# Patient Record
Sex: Male | Born: 1950 | Hispanic: Refuse to answer | Marital: Married | Smoking: Former smoker
Health system: Southern US, Community
[De-identification: ages and names within clinical notes are randomized; demographics above are authoritative.]

## PROBLEM LIST (undated history)

## (undated) DIAGNOSIS — F419 Anxiety disorder, unspecified: Secondary | ICD-10-CM

## (undated) DIAGNOSIS — M1990 Unspecified osteoarthritis, unspecified site: Secondary | ICD-10-CM

## (undated) HISTORY — PX: TONSILLECTOMY: SUR1361

## (undated) HISTORY — PX: SMALL INTESTINE SURGERY: SHX150

---

## 2006-12-18 ENCOUNTER — Ambulatory Visit (HOSPITAL_COMMUNITY): Admission: RE | Admit: 2006-12-18 | Discharge: 2006-12-18 | Payer: Self-pay | Admitting: Internal Medicine

## 2007-05-17 ENCOUNTER — Emergency Department (HOSPITAL_COMMUNITY): Admission: EM | Admit: 2007-05-17 | Discharge: 2007-05-17 | Payer: Self-pay | Admitting: Emergency Medicine

## 2009-05-10 IMAGING — CR DG CHEST 2V
2 series · 2 of 2 positions shown · non-contrast
Comparison: None

CLINICAL DATA: Chest pain, dull ache in the left breast area.
Night sweats for 2 weeks.  History of smoking.  No priors .

CHEST - 2 VIEW

[view not recorded (1 of 2)]
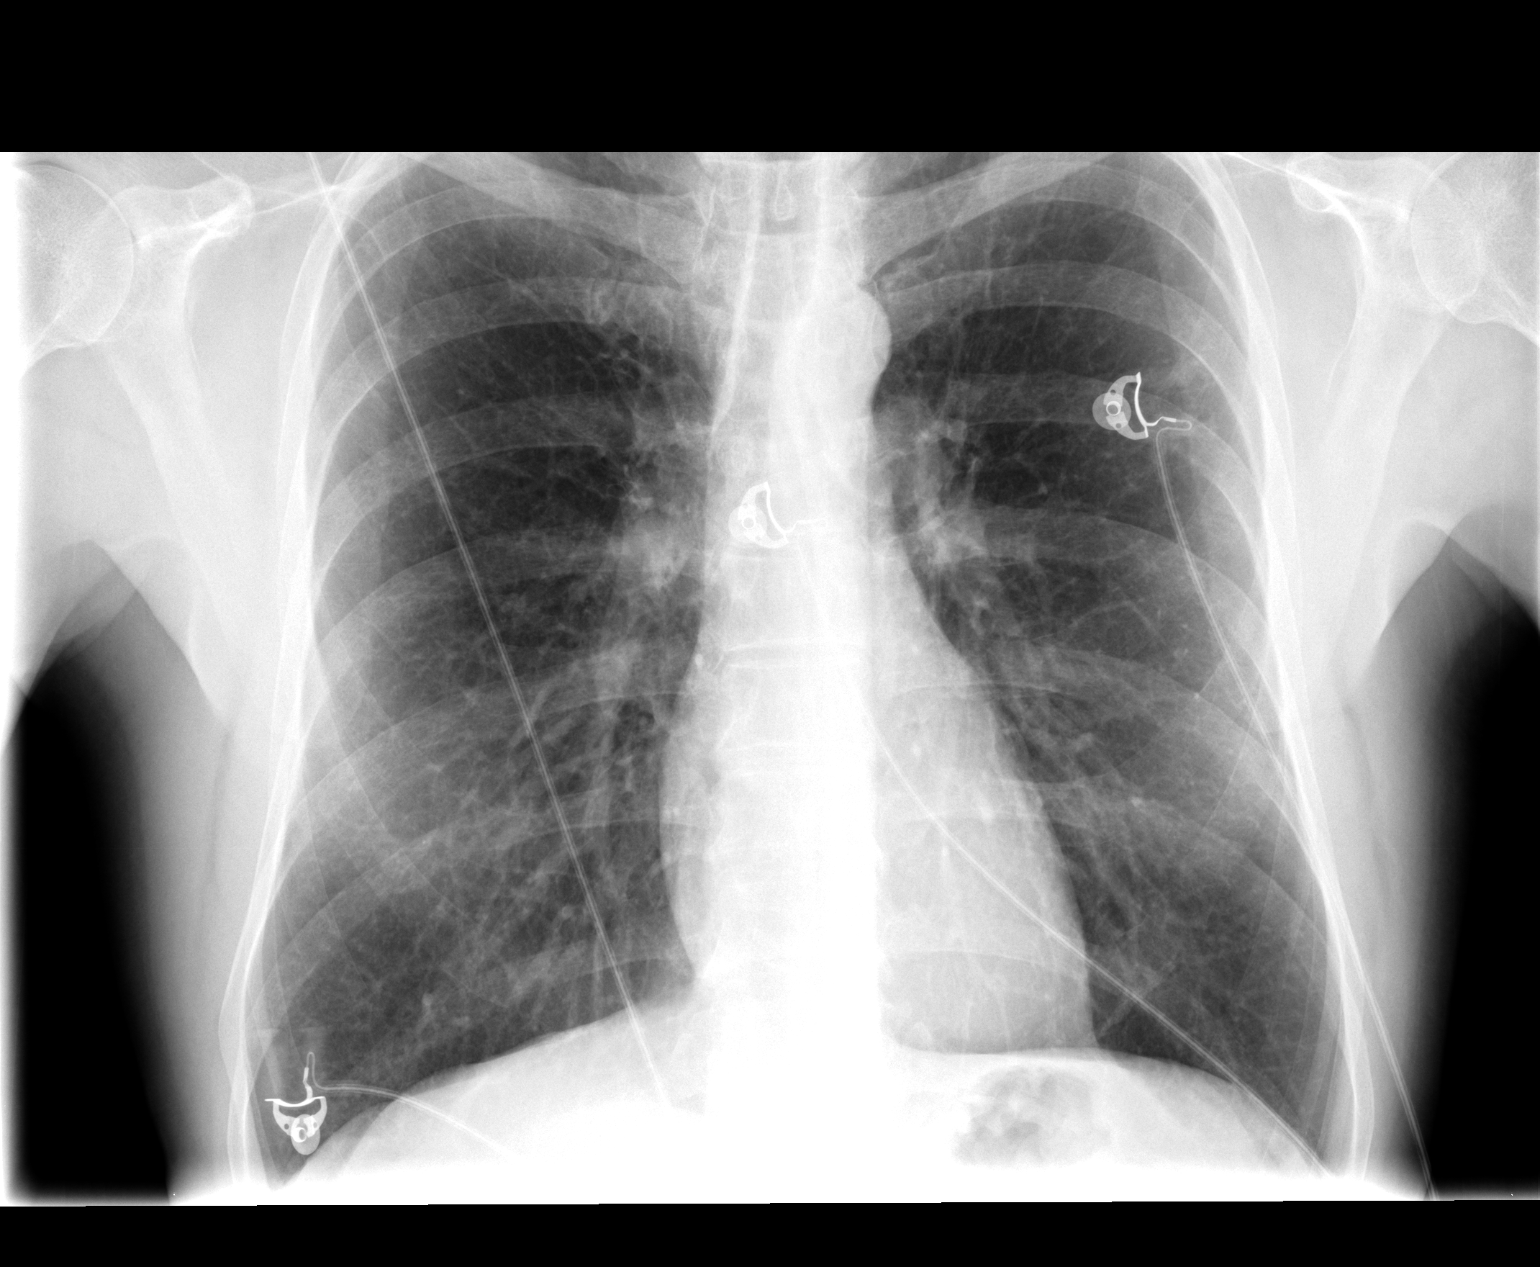

[view not recorded (2 of 2)]
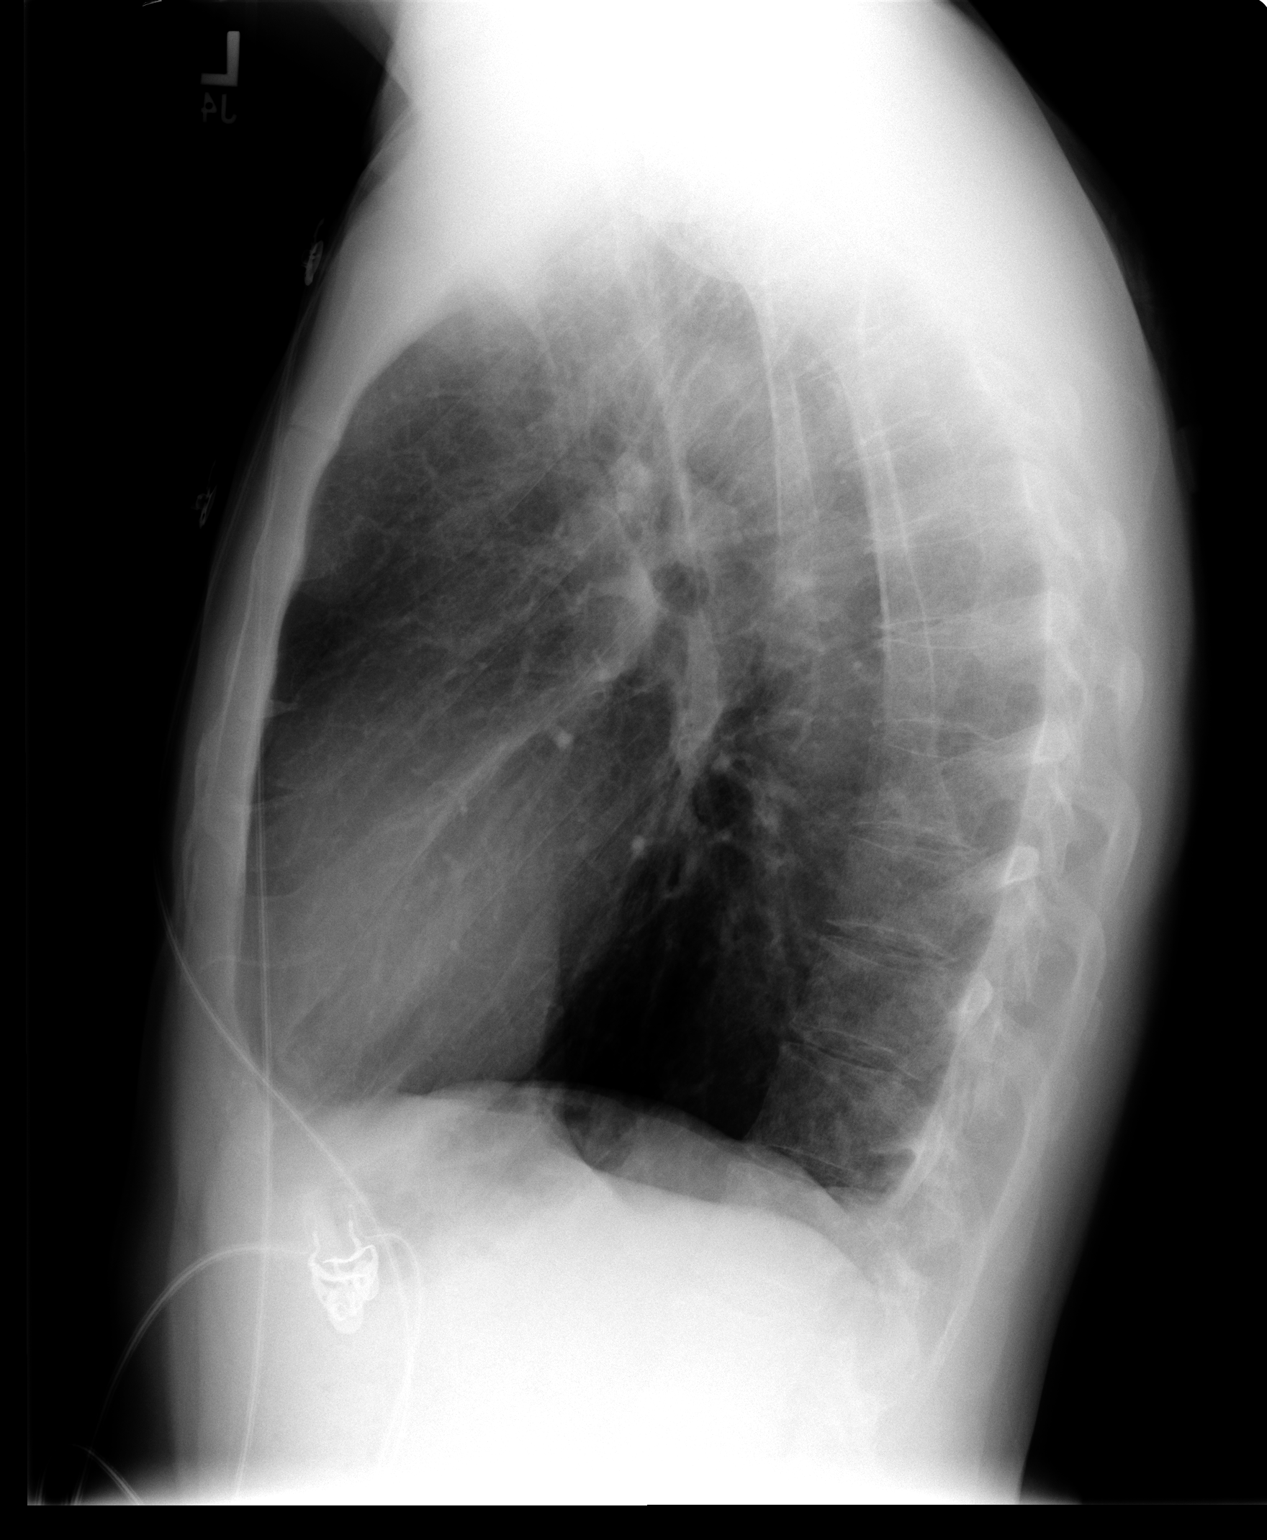

[2 of 2 positions shown; findings below may reference images not displayed]

FINDINGS: Lungs are hyperinflated.  There are perihilar
peribronchial changes which are likely chronic.  No focal
consolidations or pleural effusions are identified.
IMPRESSION: Hyperinflation and bronchitic changes.  No focal, acute pulmonary
abnormality.

## 2015-01-17 DIAGNOSIS — M9903 Segmental and somatic dysfunction of lumbar region: Secondary | ICD-10-CM | POA: Diagnosis not present

## 2015-01-17 DIAGNOSIS — M9905 Segmental and somatic dysfunction of pelvic region: Secondary | ICD-10-CM | POA: Diagnosis not present

## 2015-01-17 DIAGNOSIS — K5730 Diverticulosis of large intestine without perforation or abscess without bleeding: Secondary | ICD-10-CM | POA: Diagnosis not present

## 2015-01-17 DIAGNOSIS — R197 Diarrhea, unspecified: Secondary | ICD-10-CM | POA: Diagnosis not present

## 2015-01-17 DIAGNOSIS — M9904 Segmental and somatic dysfunction of sacral region: Secondary | ICD-10-CM | POA: Diagnosis not present

## 2015-01-17 DIAGNOSIS — M5136 Other intervertebral disc degeneration, lumbar region: Secondary | ICD-10-CM | POA: Diagnosis not present

## 2015-01-17 DIAGNOSIS — Z1211 Encounter for screening for malignant neoplasm of colon: Secondary | ICD-10-CM | POA: Diagnosis not present

## 2015-02-06 DIAGNOSIS — D125 Benign neoplasm of sigmoid colon: Secondary | ICD-10-CM | POA: Diagnosis not present

## 2015-02-06 DIAGNOSIS — D123 Benign neoplasm of transverse colon: Secondary | ICD-10-CM | POA: Diagnosis not present

## 2015-02-06 DIAGNOSIS — D122 Benign neoplasm of ascending colon: Secondary | ICD-10-CM | POA: Diagnosis not present

## 2015-02-06 DIAGNOSIS — Z1211 Encounter for screening for malignant neoplasm of colon: Secondary | ICD-10-CM | POA: Diagnosis not present

## 2015-02-06 DIAGNOSIS — K635 Polyp of colon: Secondary | ICD-10-CM | POA: Diagnosis not present

## 2015-02-06 DIAGNOSIS — K5730 Diverticulosis of large intestine without perforation or abscess without bleeding: Secondary | ICD-10-CM | POA: Diagnosis not present

## 2015-02-21 DIAGNOSIS — Z0000 Encounter for general adult medical examination without abnormal findings: Secondary | ICD-10-CM | POA: Diagnosis not present

## 2015-02-21 DIAGNOSIS — Z125 Encounter for screening for malignant neoplasm of prostate: Secondary | ICD-10-CM | POA: Diagnosis not present

## 2015-03-01 DIAGNOSIS — Z6820 Body mass index (BMI) 20.0-20.9, adult: Secondary | ICD-10-CM | POA: Diagnosis not present

## 2015-03-01 DIAGNOSIS — Z0001 Encounter for general adult medical examination with abnormal findings: Secondary | ICD-10-CM | POA: Diagnosis not present

## 2015-03-01 DIAGNOSIS — N400 Enlarged prostate without lower urinary tract symptoms: Secondary | ICD-10-CM | POA: Diagnosis not present

## 2015-03-20 DIAGNOSIS — H6123 Impacted cerumen, bilateral: Secondary | ICD-10-CM | POA: Diagnosis not present

## 2015-07-20 DIAGNOSIS — Z0101 Encounter for examination of eyes and vision with abnormal findings: Secondary | ICD-10-CM | POA: Diagnosis not present

## 2015-08-20 DIAGNOSIS — M5134 Other intervertebral disc degeneration, thoracic region: Secondary | ICD-10-CM | POA: Diagnosis not present

## 2015-08-20 DIAGNOSIS — M9902 Segmental and somatic dysfunction of thoracic region: Secondary | ICD-10-CM | POA: Diagnosis not present

## 2015-08-20 DIAGNOSIS — M546 Pain in thoracic spine: Secondary | ICD-10-CM | POA: Diagnosis not present

## 2015-09-24 ENCOUNTER — Ambulatory Visit (INDEPENDENT_AMBULATORY_CARE_PROVIDER_SITE_OTHER): Payer: PPO | Admitting: Otolaryngology

## 2015-09-24 DIAGNOSIS — H6123 Impacted cerumen, bilateral: Secondary | ICD-10-CM

## 2015-10-22 DIAGNOSIS — Z23 Encounter for immunization: Secondary | ICD-10-CM | POA: Diagnosis not present

## 2016-02-28 ENCOUNTER — Ambulatory Visit (INDEPENDENT_AMBULATORY_CARE_PROVIDER_SITE_OTHER): Payer: PPO | Admitting: Otolaryngology

## 2016-02-28 DIAGNOSIS — H6123 Impacted cerumen, bilateral: Secondary | ICD-10-CM | POA: Diagnosis not present

## 2016-03-05 DIAGNOSIS — Z79899 Other long term (current) drug therapy: Secondary | ICD-10-CM | POA: Diagnosis not present

## 2016-03-05 DIAGNOSIS — N400 Enlarged prostate without lower urinary tract symptoms: Secondary | ICD-10-CM | POA: Diagnosis not present

## 2016-03-05 DIAGNOSIS — N529 Male erectile dysfunction, unspecified: Secondary | ICD-10-CM | POA: Diagnosis not present

## 2016-03-05 DIAGNOSIS — Z125 Encounter for screening for malignant neoplasm of prostate: Secondary | ICD-10-CM | POA: Diagnosis not present

## 2016-03-13 DIAGNOSIS — Z0001 Encounter for general adult medical examination with abnormal findings: Secondary | ICD-10-CM | POA: Diagnosis not present

## 2016-03-13 DIAGNOSIS — R079 Chest pain, unspecified: Secondary | ICD-10-CM | POA: Diagnosis not present

## 2016-03-13 DIAGNOSIS — R972 Elevated prostate specific antigen [PSA]: Secondary | ICD-10-CM | POA: Diagnosis not present

## 2016-03-13 DIAGNOSIS — Z6823 Body mass index (BMI) 23.0-23.9, adult: Secondary | ICD-10-CM | POA: Diagnosis not present

## 2016-09-04 ENCOUNTER — Ambulatory Visit (INDEPENDENT_AMBULATORY_CARE_PROVIDER_SITE_OTHER): Payer: PPO | Admitting: Otolaryngology

## 2016-09-04 DIAGNOSIS — H6123 Impacted cerumen, bilateral: Secondary | ICD-10-CM | POA: Diagnosis not present

## 2016-10-14 DIAGNOSIS — M9903 Segmental and somatic dysfunction of lumbar region: Secondary | ICD-10-CM | POA: Diagnosis not present

## 2016-10-14 DIAGNOSIS — M9904 Segmental and somatic dysfunction of sacral region: Secondary | ICD-10-CM | POA: Diagnosis not present

## 2016-10-14 DIAGNOSIS — M9905 Segmental and somatic dysfunction of pelvic region: Secondary | ICD-10-CM | POA: Diagnosis not present

## 2016-10-14 DIAGNOSIS — M5136 Other intervertebral disc degeneration, lumbar region: Secondary | ICD-10-CM | POA: Diagnosis not present

## 2016-10-15 DIAGNOSIS — M9905 Segmental and somatic dysfunction of pelvic region: Secondary | ICD-10-CM | POA: Diagnosis not present

## 2016-10-15 DIAGNOSIS — M9904 Segmental and somatic dysfunction of sacral region: Secondary | ICD-10-CM | POA: Diagnosis not present

## 2016-10-15 DIAGNOSIS — M5136 Other intervertebral disc degeneration, lumbar region: Secondary | ICD-10-CM | POA: Diagnosis not present

## 2016-10-15 DIAGNOSIS — M9903 Segmental and somatic dysfunction of lumbar region: Secondary | ICD-10-CM | POA: Diagnosis not present

## 2016-10-16 DIAGNOSIS — M9904 Segmental and somatic dysfunction of sacral region: Secondary | ICD-10-CM | POA: Diagnosis not present

## 2016-10-16 DIAGNOSIS — M9903 Segmental and somatic dysfunction of lumbar region: Secondary | ICD-10-CM | POA: Diagnosis not present

## 2016-10-16 DIAGNOSIS — M9905 Segmental and somatic dysfunction of pelvic region: Secondary | ICD-10-CM | POA: Diagnosis not present

## 2016-10-16 DIAGNOSIS — M5136 Other intervertebral disc degeneration, lumbar region: Secondary | ICD-10-CM | POA: Diagnosis not present

## 2016-10-20 DIAGNOSIS — M9905 Segmental and somatic dysfunction of pelvic region: Secondary | ICD-10-CM | POA: Diagnosis not present

## 2016-10-20 DIAGNOSIS — M9904 Segmental and somatic dysfunction of sacral region: Secondary | ICD-10-CM | POA: Diagnosis not present

## 2016-10-20 DIAGNOSIS — M9903 Segmental and somatic dysfunction of lumbar region: Secondary | ICD-10-CM | POA: Diagnosis not present

## 2016-10-20 DIAGNOSIS — M5136 Other intervertebral disc degeneration, lumbar region: Secondary | ICD-10-CM | POA: Diagnosis not present

## 2016-10-22 DIAGNOSIS — M9904 Segmental and somatic dysfunction of sacral region: Secondary | ICD-10-CM | POA: Diagnosis not present

## 2016-10-22 DIAGNOSIS — M9903 Segmental and somatic dysfunction of lumbar region: Secondary | ICD-10-CM | POA: Diagnosis not present

## 2016-10-22 DIAGNOSIS — M5136 Other intervertebral disc degeneration, lumbar region: Secondary | ICD-10-CM | POA: Diagnosis not present

## 2016-10-22 DIAGNOSIS — M9905 Segmental and somatic dysfunction of pelvic region: Secondary | ICD-10-CM | POA: Diagnosis not present

## 2016-10-29 DIAGNOSIS — M9905 Segmental and somatic dysfunction of pelvic region: Secondary | ICD-10-CM | POA: Diagnosis not present

## 2016-10-29 DIAGNOSIS — M9903 Segmental and somatic dysfunction of lumbar region: Secondary | ICD-10-CM | POA: Diagnosis not present

## 2016-10-29 DIAGNOSIS — M5136 Other intervertebral disc degeneration, lumbar region: Secondary | ICD-10-CM | POA: Diagnosis not present

## 2016-10-29 DIAGNOSIS — M9904 Segmental and somatic dysfunction of sacral region: Secondary | ICD-10-CM | POA: Diagnosis not present

## 2016-10-30 DIAGNOSIS — Z23 Encounter for immunization: Secondary | ICD-10-CM | POA: Diagnosis not present

## 2016-11-21 DIAGNOSIS — M9904 Segmental and somatic dysfunction of sacral region: Secondary | ICD-10-CM | POA: Diagnosis not present

## 2016-11-21 DIAGNOSIS — M5136 Other intervertebral disc degeneration, lumbar region: Secondary | ICD-10-CM | POA: Diagnosis not present

## 2016-11-21 DIAGNOSIS — M9903 Segmental and somatic dysfunction of lumbar region: Secondary | ICD-10-CM | POA: Diagnosis not present

## 2016-11-21 DIAGNOSIS — M9905 Segmental and somatic dysfunction of pelvic region: Secondary | ICD-10-CM | POA: Diagnosis not present

## 2016-11-26 DIAGNOSIS — H35363 Drusen (degenerative) of macula, bilateral: Secondary | ICD-10-CM | POA: Diagnosis not present

## 2016-11-26 DIAGNOSIS — H5202 Hypermetropia, left eye: Secondary | ICD-10-CM | POA: Diagnosis not present

## 2016-11-26 DIAGNOSIS — H52223 Regular astigmatism, bilateral: Secondary | ICD-10-CM | POA: Diagnosis not present

## 2016-11-26 DIAGNOSIS — H5211 Myopia, right eye: Secondary | ICD-10-CM | POA: Diagnosis not present

## 2017-03-12 ENCOUNTER — Ambulatory Visit (INDEPENDENT_AMBULATORY_CARE_PROVIDER_SITE_OTHER): Payer: PPO | Admitting: Otolaryngology

## 2017-03-12 DIAGNOSIS — H6123 Impacted cerumen, bilateral: Secondary | ICD-10-CM

## 2017-03-26 DIAGNOSIS — R972 Elevated prostate specific antigen [PSA]: Secondary | ICD-10-CM | POA: Diagnosis not present

## 2017-03-26 DIAGNOSIS — N400 Enlarged prostate without lower urinary tract symptoms: Secondary | ICD-10-CM | POA: Diagnosis not present

## 2017-04-02 DIAGNOSIS — Z6822 Body mass index (BMI) 22.0-22.9, adult: Secondary | ICD-10-CM | POA: Diagnosis not present

## 2017-04-02 DIAGNOSIS — N401 Enlarged prostate with lower urinary tract symptoms: Secondary | ICD-10-CM | POA: Diagnosis not present

## 2017-04-02 DIAGNOSIS — R079 Chest pain, unspecified: Secondary | ICD-10-CM | POA: Diagnosis not present

## 2017-04-02 DIAGNOSIS — K635 Polyp of colon: Secondary | ICD-10-CM | POA: Diagnosis not present

## 2017-08-31 DIAGNOSIS — M9905 Segmental and somatic dysfunction of pelvic region: Secondary | ICD-10-CM | POA: Diagnosis not present

## 2017-08-31 DIAGNOSIS — M9903 Segmental and somatic dysfunction of lumbar region: Secondary | ICD-10-CM | POA: Diagnosis not present

## 2017-08-31 DIAGNOSIS — M5136 Other intervertebral disc degeneration, lumbar region: Secondary | ICD-10-CM | POA: Diagnosis not present

## 2017-08-31 DIAGNOSIS — M9904 Segmental and somatic dysfunction of sacral region: Secondary | ICD-10-CM | POA: Diagnosis not present

## 2017-09-01 DIAGNOSIS — M9903 Segmental and somatic dysfunction of lumbar region: Secondary | ICD-10-CM | POA: Diagnosis not present

## 2017-09-01 DIAGNOSIS — M9905 Segmental and somatic dysfunction of pelvic region: Secondary | ICD-10-CM | POA: Diagnosis not present

## 2017-09-01 DIAGNOSIS — M5136 Other intervertebral disc degeneration, lumbar region: Secondary | ICD-10-CM | POA: Diagnosis not present

## 2017-09-01 DIAGNOSIS — M9904 Segmental and somatic dysfunction of sacral region: Secondary | ICD-10-CM | POA: Diagnosis not present

## 2017-09-02 DIAGNOSIS — M9903 Segmental and somatic dysfunction of lumbar region: Secondary | ICD-10-CM | POA: Diagnosis not present

## 2017-09-02 DIAGNOSIS — M9904 Segmental and somatic dysfunction of sacral region: Secondary | ICD-10-CM | POA: Diagnosis not present

## 2017-09-02 DIAGNOSIS — M9905 Segmental and somatic dysfunction of pelvic region: Secondary | ICD-10-CM | POA: Diagnosis not present

## 2017-09-02 DIAGNOSIS — M5136 Other intervertebral disc degeneration, lumbar region: Secondary | ICD-10-CM | POA: Diagnosis not present

## 2017-09-03 DIAGNOSIS — M9904 Segmental and somatic dysfunction of sacral region: Secondary | ICD-10-CM | POA: Diagnosis not present

## 2017-09-03 DIAGNOSIS — M9905 Segmental and somatic dysfunction of pelvic region: Secondary | ICD-10-CM | POA: Diagnosis not present

## 2017-09-03 DIAGNOSIS — M9903 Segmental and somatic dysfunction of lumbar region: Secondary | ICD-10-CM | POA: Diagnosis not present

## 2017-09-03 DIAGNOSIS — M5136 Other intervertebral disc degeneration, lumbar region: Secondary | ICD-10-CM | POA: Diagnosis not present

## 2017-09-07 DIAGNOSIS — M9904 Segmental and somatic dysfunction of sacral region: Secondary | ICD-10-CM | POA: Diagnosis not present

## 2017-09-07 DIAGNOSIS — M9905 Segmental and somatic dysfunction of pelvic region: Secondary | ICD-10-CM | POA: Diagnosis not present

## 2017-09-07 DIAGNOSIS — M9903 Segmental and somatic dysfunction of lumbar region: Secondary | ICD-10-CM | POA: Diagnosis not present

## 2017-09-07 DIAGNOSIS — M5136 Other intervertebral disc degeneration, lumbar region: Secondary | ICD-10-CM | POA: Diagnosis not present

## 2017-09-08 DIAGNOSIS — M9903 Segmental and somatic dysfunction of lumbar region: Secondary | ICD-10-CM | POA: Diagnosis not present

## 2017-09-08 DIAGNOSIS — M9905 Segmental and somatic dysfunction of pelvic region: Secondary | ICD-10-CM | POA: Diagnosis not present

## 2017-09-08 DIAGNOSIS — M5136 Other intervertebral disc degeneration, lumbar region: Secondary | ICD-10-CM | POA: Diagnosis not present

## 2017-09-08 DIAGNOSIS — M9904 Segmental and somatic dysfunction of sacral region: Secondary | ICD-10-CM | POA: Diagnosis not present

## 2017-09-09 DIAGNOSIS — M5136 Other intervertebral disc degeneration, lumbar region: Secondary | ICD-10-CM | POA: Diagnosis not present

## 2017-09-09 DIAGNOSIS — M9905 Segmental and somatic dysfunction of pelvic region: Secondary | ICD-10-CM | POA: Diagnosis not present

## 2017-09-09 DIAGNOSIS — M9903 Segmental and somatic dysfunction of lumbar region: Secondary | ICD-10-CM | POA: Diagnosis not present

## 2017-09-09 DIAGNOSIS — M9904 Segmental and somatic dysfunction of sacral region: Secondary | ICD-10-CM | POA: Diagnosis not present

## 2017-09-10 DIAGNOSIS — M5136 Other intervertebral disc degeneration, lumbar region: Secondary | ICD-10-CM | POA: Diagnosis not present

## 2017-09-10 DIAGNOSIS — M9904 Segmental and somatic dysfunction of sacral region: Secondary | ICD-10-CM | POA: Diagnosis not present

## 2017-09-10 DIAGNOSIS — M9903 Segmental and somatic dysfunction of lumbar region: Secondary | ICD-10-CM | POA: Diagnosis not present

## 2017-09-10 DIAGNOSIS — M9905 Segmental and somatic dysfunction of pelvic region: Secondary | ICD-10-CM | POA: Diagnosis not present

## 2017-09-15 DIAGNOSIS — M9905 Segmental and somatic dysfunction of pelvic region: Secondary | ICD-10-CM | POA: Diagnosis not present

## 2017-09-15 DIAGNOSIS — M9903 Segmental and somatic dysfunction of lumbar region: Secondary | ICD-10-CM | POA: Diagnosis not present

## 2017-09-15 DIAGNOSIS — M5136 Other intervertebral disc degeneration, lumbar region: Secondary | ICD-10-CM | POA: Diagnosis not present

## 2017-09-15 DIAGNOSIS — M9904 Segmental and somatic dysfunction of sacral region: Secondary | ICD-10-CM | POA: Diagnosis not present

## 2017-09-16 DIAGNOSIS — M9903 Segmental and somatic dysfunction of lumbar region: Secondary | ICD-10-CM | POA: Diagnosis not present

## 2017-09-16 DIAGNOSIS — M9905 Segmental and somatic dysfunction of pelvic region: Secondary | ICD-10-CM | POA: Diagnosis not present

## 2017-09-16 DIAGNOSIS — M9904 Segmental and somatic dysfunction of sacral region: Secondary | ICD-10-CM | POA: Diagnosis not present

## 2017-09-16 DIAGNOSIS — M5136 Other intervertebral disc degeneration, lumbar region: Secondary | ICD-10-CM | POA: Diagnosis not present

## 2017-09-17 DIAGNOSIS — M9903 Segmental and somatic dysfunction of lumbar region: Secondary | ICD-10-CM | POA: Diagnosis not present

## 2017-09-17 DIAGNOSIS — M9905 Segmental and somatic dysfunction of pelvic region: Secondary | ICD-10-CM | POA: Diagnosis not present

## 2017-09-17 DIAGNOSIS — M5136 Other intervertebral disc degeneration, lumbar region: Secondary | ICD-10-CM | POA: Diagnosis not present

## 2017-09-17 DIAGNOSIS — M9904 Segmental and somatic dysfunction of sacral region: Secondary | ICD-10-CM | POA: Diagnosis not present

## 2017-09-23 DIAGNOSIS — M9905 Segmental and somatic dysfunction of pelvic region: Secondary | ICD-10-CM | POA: Diagnosis not present

## 2017-09-23 DIAGNOSIS — M9903 Segmental and somatic dysfunction of lumbar region: Secondary | ICD-10-CM | POA: Diagnosis not present

## 2017-09-23 DIAGNOSIS — M545 Low back pain: Secondary | ICD-10-CM | POA: Diagnosis not present

## 2017-09-23 DIAGNOSIS — M9902 Segmental and somatic dysfunction of thoracic region: Secondary | ICD-10-CM | POA: Diagnosis not present

## 2017-09-25 DIAGNOSIS — K4030 Unilateral inguinal hernia, with obstruction, without gangrene, not specified as recurrent: Secondary | ICD-10-CM | POA: Diagnosis not present

## 2017-09-29 DIAGNOSIS — M5136 Other intervertebral disc degeneration, lumbar region: Secondary | ICD-10-CM | POA: Diagnosis not present

## 2017-09-29 DIAGNOSIS — M9904 Segmental and somatic dysfunction of sacral region: Secondary | ICD-10-CM | POA: Diagnosis not present

## 2017-09-29 DIAGNOSIS — M9903 Segmental and somatic dysfunction of lumbar region: Secondary | ICD-10-CM | POA: Diagnosis not present

## 2017-09-29 DIAGNOSIS — M9905 Segmental and somatic dysfunction of pelvic region: Secondary | ICD-10-CM | POA: Diagnosis not present

## 2017-10-05 DIAGNOSIS — M9904 Segmental and somatic dysfunction of sacral region: Secondary | ICD-10-CM | POA: Diagnosis not present

## 2017-10-05 DIAGNOSIS — M5136 Other intervertebral disc degeneration, lumbar region: Secondary | ICD-10-CM | POA: Diagnosis not present

## 2017-10-05 DIAGNOSIS — M9905 Segmental and somatic dysfunction of pelvic region: Secondary | ICD-10-CM | POA: Diagnosis not present

## 2017-10-05 DIAGNOSIS — M9903 Segmental and somatic dysfunction of lumbar region: Secondary | ICD-10-CM | POA: Diagnosis not present

## 2017-10-14 DIAGNOSIS — M9902 Segmental and somatic dysfunction of thoracic region: Secondary | ICD-10-CM | POA: Diagnosis not present

## 2017-10-14 DIAGNOSIS — M9905 Segmental and somatic dysfunction of pelvic region: Secondary | ICD-10-CM | POA: Diagnosis not present

## 2017-10-14 DIAGNOSIS — M545 Low back pain: Secondary | ICD-10-CM | POA: Diagnosis not present

## 2017-10-14 DIAGNOSIS — M9903 Segmental and somatic dysfunction of lumbar region: Secondary | ICD-10-CM | POA: Diagnosis not present

## 2017-10-15 ENCOUNTER — Ambulatory Visit: Payer: PPO | Admitting: General Surgery

## 2017-10-16 DIAGNOSIS — K4090 Unilateral inguinal hernia, without obstruction or gangrene, not specified as recurrent: Secondary | ICD-10-CM | POA: Diagnosis not present

## 2017-10-21 DIAGNOSIS — M9905 Segmental and somatic dysfunction of pelvic region: Secondary | ICD-10-CM | POA: Diagnosis not present

## 2017-10-21 DIAGNOSIS — M9903 Segmental and somatic dysfunction of lumbar region: Secondary | ICD-10-CM | POA: Diagnosis not present

## 2017-10-21 DIAGNOSIS — M9904 Segmental and somatic dysfunction of sacral region: Secondary | ICD-10-CM | POA: Diagnosis not present

## 2017-10-21 DIAGNOSIS — M5136 Other intervertebral disc degeneration, lumbar region: Secondary | ICD-10-CM | POA: Diagnosis not present

## 2017-10-22 ENCOUNTER — Other Ambulatory Visit: Payer: Self-pay | Admitting: General Surgery

## 2017-10-22 ENCOUNTER — Ambulatory Visit: Payer: PPO | Admitting: General Surgery

## 2017-10-22 DIAGNOSIS — M9904 Segmental and somatic dysfunction of sacral region: Secondary | ICD-10-CM | POA: Diagnosis not present

## 2017-10-22 DIAGNOSIS — M9903 Segmental and somatic dysfunction of lumbar region: Secondary | ICD-10-CM | POA: Diagnosis not present

## 2017-10-22 DIAGNOSIS — M9905 Segmental and somatic dysfunction of pelvic region: Secondary | ICD-10-CM | POA: Diagnosis not present

## 2017-10-22 DIAGNOSIS — M5136 Other intervertebral disc degeneration, lumbar region: Secondary | ICD-10-CM | POA: Diagnosis not present

## 2017-10-22 NOTE — Pre-Procedure Instructions (Signed)
David Gillespie  10/22/2017      [facility] [phone] - [place], [place] - [place] [address] [place] [address] [place] [address] Phone: [phone] Fax: [phone]    Your procedure is scheduled on Wednesday October 16.  Report to [facility] Admitting at 9:00 A.M.  Call this number if you have problems the morning of surgery:  [phone]   Remember:  Do not eat or drink after midnight.    Take these medicines the morning of surgery with A SIP OF WATER: NONE  7 days prior to surgery STOP taking any Aspirin(unless otherwise instructed by your surgeon), Aleve, Naproxen, Ibuprofen, Motrin, Advil, Goody's, BC's, all herbal medications, fish oil, and all vitamins     Do not wear jewelr  Do not wear lotions, powders, or colognes, or deodorant.  Do not shave 48 hours prior to surgery.  Men may shave face and neck.  Do not bring valuables to the hospital.  [facility] is not responsible for any belongings or valuables.  Contacts, dentures or bridgework may not be worn into surgery.  Leave your suitcase in the car.  After surgery it may be brought to your room.  For patients admitted to the hospital, discharge time will be determined by your treatment team.  Patients discharged the day of surgery will not be allowed to drive home.    Special instructions:    [place]- Preparing For Surgery  Before surgery, you can play an important role. Because skin is not sterile, your skin needs to be as free of germs as possible. You can reduce the number of germs on your skin by washing with CHG (chlorahexidine gluconate) Soap before surgery.  CHG is an antiseptic cleaner which kills germs and bonds with the skin to continue killing germs even after washing.    Oral Hygiene is also important to reduce your risk of infection.  Remember - BRUSH YOUR TEETH THE MORNING OF SURGERY WITH YOUR REGULAR TOOTHPASTE  Please do not  use if you have an allergy to CHG or antibacterial soaps. If your skin becomes reddened/irritated stop using the CHG.  Do not shave (including legs and underarms) for at least 48 hours prior to first CHG shower. It is OK to shave your face.  Please follow these instructions carefully.   1. Shower the NIGHT BEFORE SURGERY and the MORNING OF SURGERY with CHG.   2. If you chose to wash your hair, wash your hair first as usual with your normal shampoo.  3. After you shampoo, rinse your hair and body thoroughly to remove the shampoo.  4. Use CHG as you would any other liquid soap. You can apply CHG directly to the skin and wash gently with a scrungie or a clean washcloth.   5. Apply the CHG Soap to your body ONLY FROM THE NECK DOWN.  Do not use on open wounds or open sores. Avoid contact with your eyes, ears, mouth and genitals (private parts). Wash Face and genitals (private parts)  with your normal soap.  6. Wash thoroughly, paying special attention to the area where your surgery will be performed.  7. Thoroughly rinse your body with warm water from the neck down.  8. DO NOT shower/wash with your normal soap after using and rinsing off the CHG Soap.  9. Pat yourself dry with a CLEAN TOWEL.  10. Wear CLEAN PAJAMAS to bed the night before surgery, wear comfortable  clothes the morning of surgery  11. Place CLEAN SHEETS on your bed the night of your first shower and DO NOT SLEEP WITH PETS.    Day of Surgery:  Do not apply any deodorants/lotions.  Please wear clean clothes to the hospital/surgery center.   Remember to brush your teeth WITH YOUR REGULAR TOOTHPASTE.    Please read over the following fact sheets that you were given. Coughing and Deep Breathing and Surgical Site Infection Prevention

## 2017-10-23 ENCOUNTER — Encounter (HOSPITAL_COMMUNITY): Payer: Self-pay

## 2017-10-23 ENCOUNTER — Encounter (HOSPITAL_COMMUNITY)
Admission: RE | Admit: 2017-10-23 | Discharge: 2017-10-23 | Disposition: A | Discharge status: Home / Self Care | Payer: PPO | Source: Ambulatory Visit | Attending: General Surgery | Admitting: General Surgery

## 2017-10-23 DIAGNOSIS — Z01812 Encounter for preprocedural laboratory examination: Secondary | ICD-10-CM | POA: Insufficient documentation

## 2017-10-23 HISTORY — DX: Unspecified osteoarthritis, unspecified site: M19.90

## 2017-10-23 HISTORY — DX: Anxiety disorder, unspecified: F41.9

## 2017-10-23 LAB — CBC
HCT: 45 % (ref 39.0–52.0)
HEMOGLOBIN: 14.2 g/dL (ref 13.0–17.0)
MCH: 31.6 pg (ref 26.0–34.0)
MCHC: 31.6 g/dL (ref 30.0–36.0)
MCV: 100.2 fL — ABNORMAL HIGH (ref 80.0–100.0)
PLATELETS: 261 10*3/uL (ref 150–400)
RBC: 4.49 MIL/uL (ref 4.22–5.81)
RDW: 13 % (ref 11.5–15.5)
WBC: 3.9 10*3/uL — ABNORMAL LOW (ref 4.0–10.5)
nRBC: 0 % (ref 0.0–0.2)

## 2017-10-23 LAB — BASIC METABOLIC PANEL
ANION GAP: 6 (ref 5–15)
BUN: 12 mg/dL (ref 8–23)
CHLORIDE: 106 mmol/L (ref 98–111)
CO2: 26 mmol/L (ref 22–32)
Calcium: 9.4 mg/dL (ref 8.9–10.3)
Creatinine, Ser: 0.81 mg/dL (ref 0.61–1.24)
Glucose, Bld: 105 mg/dL — ABNORMAL HIGH (ref 70–99)
POTASSIUM: 4.5 mmol/L (ref 3.5–5.1)
SODIUM: 138 mmol/L (ref 135–145)

## 2017-10-23 MED ORDER — CHLORHEXIDINE GLUCONATE CLOTH 2 % EX PADS: 6.0000 | MEDICATED_PAD | Freq: Once | CUTANEOUS | Status: DC

## 2017-10-26 DIAGNOSIS — M5136 Other intervertebral disc degeneration, lumbar region: Secondary | ICD-10-CM | POA: Diagnosis not present

## 2017-10-26 DIAGNOSIS — M9905 Segmental and somatic dysfunction of pelvic region: Secondary | ICD-10-CM | POA: Diagnosis not present

## 2017-10-26 DIAGNOSIS — M9903 Segmental and somatic dysfunction of lumbar region: Secondary | ICD-10-CM | POA: Diagnosis not present

## 2017-10-26 DIAGNOSIS — M9904 Segmental and somatic dysfunction of sacral region: Secondary | ICD-10-CM | POA: Diagnosis not present

## 2017-10-28 ENCOUNTER — Ambulatory Visit (HOSPITAL_COMMUNITY): Payer: PPO | Admitting: Anesthesiology

## 2017-10-28 ENCOUNTER — Ambulatory Visit (HOSPITAL_COMMUNITY)
Admission: RE | Admit: 2017-10-28 | Discharge: 2017-10-28 | Disposition: A | Discharge status: Home / Self Care | Payer: PPO | Source: Ambulatory Visit | Attending: General Surgery | Admitting: General Surgery

## 2017-10-28 ENCOUNTER — Encounter (HOSPITAL_COMMUNITY): Payer: Self-pay

## 2017-10-28 ENCOUNTER — Encounter (HOSPITAL_COMMUNITY)
Admission: RE | Disposition: A | Discharge status: Home / Self Care | Payer: Self-pay | Source: Ambulatory Visit | Attending: General Surgery

## 2017-10-28 DIAGNOSIS — Z87891 Personal history of nicotine dependence: Secondary | ICD-10-CM | POA: Insufficient documentation

## 2017-10-28 DIAGNOSIS — G8918 Other acute postprocedural pain: Secondary | ICD-10-CM | POA: Diagnosis not present

## 2017-10-28 DIAGNOSIS — K4090 Unilateral inguinal hernia, without obstruction or gangrene, not specified as recurrent: Secondary | ICD-10-CM | POA: Diagnosis not present

## 2017-10-28 HISTORY — PX: INGUINAL HERNIA REPAIR: SHX194

## 2017-10-28 HISTORY — PX: INSERTION OF MESH: SHX5868

## 2017-10-28 SURGERY — REPAIR, HERNIA, INGUINAL, ADULT
Anesthesia: General | Site: Abdomen | Laterality: Right

## 2017-10-28 MED ORDER — GABAPENTIN 300 MG PO CAPS
ORAL_CAPSULE | ORAL | Status: AC
  Administered 2017-10-28: 100 mg via ORAL
  Filled 2017-10-28: qty 1

## 2017-10-28 MED ORDER — FENTANYL CITRATE (PF) 100 MCG/2ML IJ SOLN: 25.0000 ug | INTRAMUSCULAR | Status: DC | PRN

## 2017-10-28 MED ORDER — MEPERIDINE HCL 50 MG/ML IJ SOLN: 6.2500 mg | INTRAMUSCULAR | Status: DC | PRN

## 2017-10-28 MED ORDER — PROPOFOL 10 MG/ML IV BOLUS
INTRAVENOUS | Status: DC | PRN
  Administered 2017-10-28: 160 mg via INTRAVENOUS

## 2017-10-28 MED ORDER — ONDANSETRON HCL 4 MG/2ML IJ SOLN
INTRAMUSCULAR | Status: AC
  Filled 2017-10-28: qty 2

## 2017-10-28 MED ORDER — SUFENTANIL CITRATE 50 MCG/ML IV SOLN
INTRAVENOUS | Status: DC | PRN
  Administered 2017-10-28: 10 ug via INTRAVENOUS

## 2017-10-28 MED ORDER — OXYCODONE HCL 5 MG PO TABS: 5.0000 mg | ORAL_TABLET | Freq: Four times a day (QID) | ORAL | 0 refills | Status: DC | PRN

## 2017-10-28 MED ORDER — LIDOCAINE 2% (20 MG/ML) 5 ML SYRINGE
INTRAMUSCULAR | Status: AC
  Filled 2017-10-28: qty 5

## 2017-10-28 MED ORDER — SODIUM CHLORIDE 0.9 % IJ SOLN
INTRAMUSCULAR | Status: AC
  Filled 2017-10-28: qty 10

## 2017-10-28 MED ORDER — ROCURONIUM BROMIDE 50 MG/5ML IV SOSY
PREFILLED_SYRINGE | INTRAVENOUS | Status: AC
  Filled 2017-10-28: qty 5

## 2017-10-28 MED ORDER — MIDAZOLAM HCL 2 MG/2ML IJ SOLN
INTRAMUSCULAR | Status: AC
  Filled 2017-10-28: qty 2

## 2017-10-28 MED ORDER — LIDOCAINE HCL (CARDIAC) PF 100 MG/5ML IV SOSY
PREFILLED_SYRINGE | INTRAVENOUS | Status: DC | PRN
  Administered 2017-10-28: 100 mg via INTRAVENOUS

## 2017-10-28 MED ORDER — MIDAZOLAM HCL 5 MG/5ML IJ SOLN
INTRAMUSCULAR | Status: DC | PRN
  Administered 2017-10-28: 2 mg via INTRAVENOUS

## 2017-10-28 MED ORDER — SUGAMMADEX SODIUM 200 MG/2ML IV SOLN
INTRAVENOUS | Status: DC | PRN
  Administered 2017-10-28: 200 mg via INTRAVENOUS

## 2017-10-28 MED ORDER — GABAPENTIN 100 MG PO CAPS
100.0000 mg | ORAL_CAPSULE | ORAL | Status: AC
  Administered 2017-10-28: 100 mg via ORAL
  Filled 2017-10-28: qty 1

## 2017-10-28 MED ORDER — 0.9 % SODIUM CHLORIDE (POUR BTL) OPTIME
TOPICAL | Status: DC | PRN
  Administered 2017-10-28: 1000 mL

## 2017-10-28 MED ORDER — ONDANSETRON HCL 4 MG/2ML IJ SOLN
INTRAMUSCULAR | Status: DC | PRN
  Administered 2017-10-28: 4 mg via INTRAVENOUS

## 2017-10-28 MED ORDER — PROPOFOL 10 MG/ML IV BOLUS
INTRAVENOUS | Status: AC
  Filled 2017-10-28: qty 20

## 2017-10-28 MED ORDER — OXYCODONE HCL 5 MG PO TABS
5.0000 mg | ORAL_TABLET | Freq: Once | ORAL | Status: AC
  Administered 2017-10-28: 5 mg via ORAL

## 2017-10-28 MED ORDER — METOCLOPRAMIDE HCL 5 MG/ML IJ SOLN: 10.0000 mg | Freq: Once | INTRAMUSCULAR | Status: DC | PRN

## 2017-10-28 MED ORDER — SODIUM CHLORIDE 0.9 % IV SOLN
INTRAVENOUS | Status: DC | PRN
  Administered 2017-10-28: 25 ug/min via INTRAVENOUS

## 2017-10-28 MED ORDER — SUFENTANIL CITRATE 50 MCG/ML IV SOLN
INTRAVENOUS | Status: AC
  Filled 2017-10-28: qty 1

## 2017-10-28 MED ORDER — OXYCODONE HCL 5 MG PO TABS
ORAL_TABLET | ORAL | Status: AC
  Administered 2017-10-28: 5 mg via ORAL
  Filled 2017-10-28: qty 1

## 2017-10-28 MED ORDER — FENTANYL CITRATE (PF) 100 MCG/2ML IJ SOLN
100.0000 ug | Freq: Once | INTRAMUSCULAR | Status: AC
  Administered 2017-10-28: 100 ug via INTRAVENOUS

## 2017-10-28 MED ORDER — MIDAZOLAM HCL 2 MG/2ML IJ SOLN
INTRAMUSCULAR | Status: AC
  Administered 2017-10-28: 1 mg via INTRAVENOUS
  Filled 2017-10-28: qty 2

## 2017-10-28 MED ORDER — MIDAZOLAM HCL 2 MG/2ML IJ SOLN
1.0000 mg | Freq: Once | INTRAMUSCULAR | Status: AC
  Administered 2017-10-28: 1 mg via INTRAVENOUS

## 2017-10-28 MED ORDER — BUPIVACAINE HCL (PF) 0.25 % IJ SOLN
INTRAMUSCULAR | Status: DC | PRN
  Administered 2017-10-28: 5 mL

## 2017-10-28 MED ORDER — LACTATED RINGERS IV SOLN
INTRAVENOUS | Status: DC
  Administered 2017-10-28 (×2): via INTRAVENOUS

## 2017-10-28 MED ORDER — FENTANYL CITRATE (PF) 100 MCG/2ML IJ SOLN
INTRAMUSCULAR | Status: AC
  Administered 2017-10-28: 100 ug via INTRAVENOUS
  Filled 2017-10-28: qty 2

## 2017-10-28 MED ORDER — PHENYLEPHRINE HCL 10 MG/ML IJ SOLN
INTRAMUSCULAR | Status: DC | PRN
  Administered 2017-10-28: 120 ug via INTRAVENOUS
  Administered 2017-10-28 (×3): 80 ug via INTRAVENOUS

## 2017-10-28 MED ORDER — DEXAMETHASONE SODIUM PHOSPHATE 10 MG/ML IJ SOLN
INTRAMUSCULAR | Status: DC | PRN
  Administered 2017-10-28: 10 mg via INTRAVENOUS

## 2017-10-28 MED ORDER — ACETAMINOPHEN 500 MG PO TABS
ORAL_TABLET | ORAL | Status: AC
  Administered 2017-10-28: 1000 mg via ORAL
  Filled 2017-10-28: qty 2

## 2017-10-28 MED ORDER — CEFAZOLIN SODIUM-DEXTROSE 2-4 GM/100ML-% IV SOLN
INTRAVENOUS | Status: AC
  Filled 2017-10-28: qty 100

## 2017-10-28 MED ORDER — ROPIVACAINE HCL 5 MG/ML IJ SOLN
INTRAMUSCULAR | Status: DC | PRN
  Administered 2017-10-28: 30 mL

## 2017-10-28 MED ORDER — ENSURE PRE-SURGERY PO LIQD
296.0000 mL | Freq: Once | ORAL | Status: DC
  Filled 2017-10-28: qty 296

## 2017-10-28 MED ORDER — ROCURONIUM 10MG/ML (10ML) SYRINGE FOR MEDFUSION PUMP - OPTIME
INTRAVENOUS | Status: DC | PRN
  Administered 2017-10-28: 50 mg via INTRAVENOUS

## 2017-10-28 MED ORDER — DEXAMETHASONE SODIUM PHOSPHATE 10 MG/ML IJ SOLN
INTRAMUSCULAR | Status: AC
  Filled 2017-10-28: qty 1

## 2017-10-28 MED ORDER — CEFAZOLIN SODIUM-DEXTROSE 2-4 GM/100ML-% IV SOLN
2.0000 g | INTRAVENOUS | Status: AC
  Administered 2017-10-28: 2 g via INTRAVENOUS

## 2017-10-28 MED ORDER — ACETAMINOPHEN 500 MG PO TABS
1000.0000 mg | ORAL_TABLET | ORAL | Status: AC
  Administered 2017-10-28: 1000 mg via ORAL

## 2017-10-28 SURGICAL SUPPLY — 52 items
BLADE CLIPPER SURG (BLADE) ×3 IMPLANT
BLADE SURG 10 STRL SS (BLADE) IMPLANT
BLADE SURG 15 STRL LF DISP TIS (BLADE) IMPLANT
BLADE SURG 15 STRL SS (BLADE)
CANISTER SUCT 3000ML PPV (MISCELLANEOUS) IMPLANT
CHLORAPREP W/TINT 26ML (MISCELLANEOUS) ×3 IMPLANT
COVER SURGICAL LIGHT HANDLE (MISCELLANEOUS) ×3 IMPLANT
DECANTER SPIKE VIAL GLASS SM (MISCELLANEOUS) IMPLANT
DERMABOND ADVANCED (GAUZE/BANDAGES/DRESSINGS) ×2
DERMABOND ADVANCED .7 DNX12 (GAUZE/BANDAGES/DRESSINGS) ×1 IMPLANT
DRAIN PENROSE 1/2X12 LTX STRL (WOUND CARE) ×3 IMPLANT
DRAPE LAPAROTOMY TRNSV 102X78 (DRAPE) ×3 IMPLANT
DRAPE UTILITY XL STRL (DRAPES) IMPLANT
ELECT CAUTERY BLADE 6.4 (BLADE) ×3 IMPLANT
ELECT REM PT RETURN 9FT ADLT (ELECTROSURGICAL) ×3
ELECTRODE REM PT RTRN 9FT ADLT (ELECTROSURGICAL) ×1 IMPLANT
GAUZE 4X4 16PLY RFD (DISPOSABLE) IMPLANT
GLOVE BIO SURGEON STRL SZ7 (GLOVE) ×6 IMPLANT
GLOVE BIOGEL PI IND STRL 7.0 (GLOVE) ×1 IMPLANT
GLOVE BIOGEL PI IND STRL 7.5 (GLOVE) ×1 IMPLANT
GLOVE BIOGEL PI IND STRL 8 (GLOVE) ×1 IMPLANT
GLOVE BIOGEL PI INDICATOR 7.0 (GLOVE) ×2
GLOVE BIOGEL PI INDICATOR 7.5 (GLOVE) ×2
GLOVE BIOGEL PI INDICATOR 8 (GLOVE) ×2
GLOVE SURG SS PI 8.0 STRL IVOR (GLOVE) ×3 IMPLANT
GOWN STRL REUS W/ TWL LRG LVL3 (GOWN DISPOSABLE) ×3 IMPLANT
GOWN STRL REUS W/TWL LRG LVL3 (GOWN DISPOSABLE) ×6
KIT BASIN OR (CUSTOM PROCEDURE TRAY) ×3 IMPLANT
KIT TURNOVER KIT B (KITS) ×3 IMPLANT
MARKER SKIN DUAL TIP RULER LAB (MISCELLANEOUS) IMPLANT
MESH ULTRAPRO 3X6 7.6X15CM (Mesh General) ×3 IMPLANT
NEEDLE HYPO 25GX1X1/2 BEV (NEEDLE) ×3 IMPLANT
NS IRRIG 1000ML POUR BTL (IV SOLUTION) ×3 IMPLANT
PACK GENERAL/GYN (CUSTOM PROCEDURE TRAY) ×3 IMPLANT
PACK SURGICAL SETUP 50X90 (CUSTOM PROCEDURE TRAY) IMPLANT
PAD ARMBOARD 7.5X6 YLW CONV (MISCELLANEOUS) ×3 IMPLANT
PENCIL BUTTON HOLSTER BLD 10FT (ELECTRODE) IMPLANT
PENCIL SMOKE EVACUATOR (MISCELLANEOUS) ×3 IMPLANT
SPONGE LAP 18X18 X RAY DECT (DISPOSABLE) IMPLANT
SUT MNCRL AB 4-0 PS2 18 (SUTURE) ×3 IMPLANT
SUT VIC AB 2-0 CT1 27 (SUTURE) ×2
SUT VIC AB 2-0 CT1 TAPERPNT 27 (SUTURE) ×1 IMPLANT
SUT VIC AB 2-0 SH 18 (SUTURE) ×6 IMPLANT
SUT VIC AB 3-0 SH 27 (SUTURE) ×2
SUT VIC AB 3-0 SH 27XBRD (SUTURE) ×1 IMPLANT
SUT VICRYL AB 2 0 TIES (SUTURE) ×3 IMPLANT
SYR CONTROL 10ML LL (SYRINGE) ×3 IMPLANT
TOWEL OR 17X24 6PK STRL BLUE (TOWEL DISPOSABLE) ×3 IMPLANT
TOWEL OR 17X26 10 PK STRL BLUE (TOWEL DISPOSABLE) IMPLANT
TUBE CONNECTING 12'X1/4 (SUCTIONS)
TUBE CONNECTING 12X1/4 (SUCTIONS) IMPLANT
YANKAUER SUCT BULB TIP NO VENT (SUCTIONS) IMPLANT

## 2017-10-28 NOTE — H&P (Signed)
61 yom referred by Dr Willey Blade for new rih. He is healthy, been dealing with back pain. got out of shower mid september and noted a right groin bulge. some discomfort. always there. has what sounds like bph symptoms with nocturia, decreased stream and frequency. would like to consider repair   Past Surgical History Illene Regulus, CMA; 10/16/2017 9:15 AM) Colon Polyp Removal - Colonoscopy  Tonsillectomy   Diagnostic Studies History Lars Mage Spillers, CMA; 10/16/2017 9:15 AM) Colonoscopy  1-5 years ago  Allergies Lars Mage Spillers, CMA; 10/16/2017 9:16 AM) No Known Drug Allergies [10/16/2017]:  Medication History (Alisha Spillers, CMA; 10/16/2017 9:16 AM) No Current Medications Medications Reconciled  Social History Illene Regulus, CMA; 10/16/2017 9:15 AM) Alcohol use  Moderate alcohol use. Caffeine use  Coffee. No drug use  Tobacco use  Former smoker.  Family History Illene Regulus, CMA; 10/16/2017 9:15 AM) Breast Cancer  Mother. Cerebrovascular Accident  Father. Prostate Cancer  Brother.  Other Problems Lars Mage Spillers, CMA; 10/16/2017 9:15 AM) Back Pain    Review of Systems (Alisha Spillers CMA; 10/16/2017 9:15 AM) General Not Present- Appetite Loss, Chills, Fatigue, Fever, Night Sweats, Weight Gain and Weight Loss. Skin Not Present- Change in Wart/Mole, Dryness, Hives, Jaundice, New Lesions, Non-Healing Wounds, Rash and Ulcer. HEENT Present- Wears glasses/contact lenses. Not Present- Earache, Hearing Loss, Hoarseness, Nose Bleed, Oral Ulcers, Ringing in the Ears, Seasonal Allergies, Sinus Pain, Sore Throat, Visual Disturbances and Yellow Eyes. Respiratory Not Present- Bloody sputum, Chronic Cough, Difficulty Breathing, Snoring and Wheezing. Breast Not Present- Breast Mass, Breast Pain, Nipple Discharge and Skin Changes. Cardiovascular Not Present- Chest Pain, Difficulty Breathing Lying Down, Leg Cramps, Palpitations, Rapid Heart Rate, Shortness of Breath and  Swelling of Extremities. Gastrointestinal Not Present- Abdominal Pain, Bloating, Bloody Stool, Change in Bowel Habits, Chronic diarrhea, Constipation, Difficulty Swallowing, Excessive gas, Gets full quickly at meals, Hemorrhoids, Indigestion, Nausea, Rectal Pain and Vomiting. Male Genitourinary Present- Frequency and Nocturia. Not Present- Blood in Urine, Change in Urinary Stream, Impotence, Painful Urination, Urgency and Urine Leakage. Musculoskeletal Present- Back Pain. Not Present- Joint Pain, Joint Stiffness, Muscle Pain, Muscle Weakness and Swelling of Extremities. Neurological Not Present- Decreased Memory, Fainting, Headaches, Numbness, Seizures, Tingling, Tremor, Trouble walking and Weakness. Psychiatric Not Present- Anxiety, Bipolar, Change in Sleep Pattern, Depression, Fearful and Frequent crying. Endocrine Not Present- Cold Intolerance, Excessive Hunger, Hair Changes, Heat Intolerance, Hot flashes and New Diabetes. Hematology Not Present- Easy Bruising, Excessive bleeding, Gland problems, HIV and Persistent Infections.  Vitals (Alisha Spillers CMA; 10/16/2017 9:16 AM) 10/16/2017 9:16 AM Weight: 156 lb Height: 71in Body Surface Area: 1.9 m Body Mass Index: 21.76 kg/m  Pulse: 80 (Regular)  BP: 122/64 (Sitting, Left Arm, Standard) Physical Exam Rolm Bookbinder MD; 10/16/2017 9:31 AM) General Mental Status-Alert. Head and Neck Trachea-midline. Thyroid Gland Characteristics - normal size and consistency. Eye Sclera/Conjunctiva - Bilateral-No scleral icterus. Chest and Lung Exam Chest and lung exam reveals -quiet, even and easy respiratory effort with no use of accessory muscles and on auscultation, normal breath sounds, no adventitious sounds and normal vocal resonance. Cardiovascular Cardiovascular examination reveals -normal heart sounds, regular rate and rhythm with no murmurs. Abdomen Note: soft nt/nd reducible rih, no lih nl testes and  penis Neurologic Neurologic evaluation reveals -alert and oriented x 3 with no impairment of recent or remote memory.   Assessment & Plan Rolm Bookbinder MD; 10/16/2017 9:32 AM) RIGHT INGUINAL HERNIA (K40.90) Story: RIH with mesh We discussed observation versus repair. We discussed both laparoscopic and open inguinal hernia repairs. I described the  procedure in detail. The patient was given educational material. Goals should be achieved with surgery. We discussed the usage of mesh and the rationale behind that. We went over the pathophysiology of inguinal hernia. We have elected to perform open inguinal hernia repair with mesh. We discussed the risks including bleeding, infection, recurrence, postoperative pain and chronic groin pain, testicular injury, urinary retention, numbness in groin and around incision.

## 2017-10-28 NOTE — Op Note (Signed)
Preoperative diagnosis: right inguinal hernia Postoperative diagnosis: direct right inguinal hernia Procedure: Right inguinal hernia repair with Ultrapro mesh Surgeon: Dr Serita Grammes Anesthesia: general with TAP block EBL minimal Drains none Specimens none Complications none Sponge and needle count correct times two dispo to recovery stable  Indications: This is a 68 yom with symptomatic right groin hernia that we discussed repair.  We discussed open repair with mesh.  Procedure: After informed consent was obtained patient was taken to the operating room. He first underwent a TAP block.  He was given antibiotics and SCDs were in place. He was placed under general anesthesia. He was prepped and draped in standard sterile surgical fashion. Surgical timeout was performed.    I infiltrated marcaine in the skin.  I made an incision and carried it through scarpas fascia and the external abdominal oblique.  I then encircled the spermatic cord with a penrose drain.  There was no evidence of an indirect hernia.  There was a large direct hernia present.  I elected to use 2-0 vicryl and close the internal oblique to the shelving edge. I then used a sheet of ultrapro mesh fashioned to the space. I then sewed this with 2-0 vicryl suture to the pubic tubercle and then every 5 mm to the shelving edge inferiorly. I made a t cut in the mesh and wrapped it around the internal ring.  I then sutured the superior portion to the oblique and laid the lateral portion flat under the external oblique. Hemostasis was observed.  I then closed the external oblique with 2-0 vicryl suture. I then closed Scarpas fascia with 3-0 vicryl. The skin was closed with 4-0 monocryl and glue. He tolerated well, was extubated and transferred to recovery stable.

## 2017-10-28 NOTE — Discharge Instructions (Signed)
[facility] ° °UMBILICAL OR INGUINAL HERNIA REPAIR: POST OP INSTRUCTIONS ° °Always review your discharge instruction sheet given to you by the facility where your surgery was performed. °IF YOU HAVE DISABILITY OR FAMILY LEAVE FORMS, YOU MUST BRING THEM TO THE OFFICE FOR PROCESSING.   °DO NOT GIVE THEM TO YOUR DOCTOR. ° °1. A  prescription for pain medication may be given to you upon discharge.  Take your pain medication as prescribed, if needed.  If narcotic pain medicine is not needed, then you may take acetaminophen (Tylenol), naprosyn (Alleve) or ibuprofen (Advil) as needed. °2. Take your usually prescribed medications unless otherwise directed. °3. If you need a refill on your pain medication, please contact your pharmacy.  They will contact our office to request authorization. Prescriptions will not be filled after 5 pm or on week-ends. °4. You should follow a light diet the first 24 hours after arrival home, such as soup and crackers, etc.  Be sure to include lots of fluids daily.  Resume your normal diet the day after surgery. °5. Most patients will experience some swelling and bruising around the umbilicus or in the groin and scrotum.  Ice packs and reclining will help.  Swelling and bruising can take several days to resolve.  °6. It is common to experience some constipation if taking pain medication after surgery.  Increasing fluid intake and taking a stool softener (such as Colace) will usually help or prevent this problem from occurring.  A mild laxative (Milk of Magnesia or Miralax) should be taken according to package directions if there are no bowel movements after 48 hours. °7. Unless discharge instructions indicate otherwise, you may remove your bandages 48 hours after surgery, and you may shower at that time.  You may have steri-strips (small skin tapes) in place directly over the incision.  These strips should be left on the skin for 7-10 days and will come off on their own.   If your surgeon used skin glue on the incision, you may shower in 24 hours.  The glue will flake off over the next 2-3 weeks.  Any sutures or staples will be removed at the office during your follow-up visit. °8. ACTIVITIES:  You may resume regular (light) daily activities beginning the next day--such as daily self-care, walking, climbing stairs--gradually increasing activities as tolerated.  You may have sexual intercourse when it is comfortable.  Refrain from any heavy lifting or straining until approved by your doctor. °a. You may drive when you are no longer taking prescription pain medication, you can comfortably wear a seatbelt, and you can safely maneuver your car and apply brakes. °b. RETURN TO WORK:  __________________________________________________________ °9. You should see your doctor in the office for a follow-up appointment approximately 2-3 weeks after your surgery.  Make sure that you call for this appointment within a day or two after you arrive home to insure a convenient appointment time. °10. OTHER INSTRUCTIONS:  __________________________________________________________________________________________________________________________________________________________________________________________  °WHEN TO CALL YOUR DOCTOR: °1. Fever over 101.0 °2. Inability to urinate °3. Nausea and/or vomiting °4. Extreme swelling or bruising °5. Continued bleeding from incision. °6. Increased pain, redness, or drainage from the incision ° °The clinic staff is available to answer your questions during regular business hours.  Please don’t hesitate to call and ask to speak to one of the nurses for clinical concerns.  If you have a medical emergency, go to the nearest emergency room or call 911.  A surgeon from [facility]   is always on call at the hospital ° ° °[address], [address], [place], [place]  [address] ? ° [address] [address], [place], [place]   [address] °[phone] ? [phone] ? FAX  [phone] °Web site: [email or web address] ° ° °

## 2017-10-28 NOTE — Interval H&P Note (Signed)
History and Physical Interval Note:  10/28/2017 10:19 AM  David Gillespie  has presented today for surgery, with the diagnosis of right inguinal hernia  The various methods of treatment have been discussed with the patient and family. After consideration of risks, benefits and other options for treatment, the patient has consented to  Procedure(s) with comments: [place] (Right) - TAP BLOCK INSERTION OF MESH (Right) as a surgical intervention .  The patient's history has been reviewed, patient examined, no change in status, stable for surgery.  I have reviewed the patient's chart and labs.  Questions were answered to the patient's satisfaction.     Rolm Bookbinder

## 2017-10-28 NOTE — Anesthesia Procedure Notes (Signed)
Procedure Name: Intubation Date/Time: 10/28/2017 10:51 AM Performed by: Claris Che, CRNA Pre-anesthesia Checklist: Patient identified, Emergency Drugs available, Suction available, Patient being monitored and Timeout performed Patient Re-evaluated:Patient Re-evaluated prior to induction Oxygen Delivery Method: Circle system utilized Preoxygenation: Pre-oxygenation with 100% oxygen Induction Type: IV induction Ventilation: Mask ventilation without difficulty Laryngoscope Size: Mac and 3 Grade View: Grade I Tube size: 8.0 mm Number of attempts: 1 Airway Equipment and Method: Stylet Placement Confirmation: ETT inserted through vocal cords under direct vision,  positive ETCO2 and breath sounds checked- equal and bilateral Secured at: 24 cm Tube secured with: Tape Dental Injury: Teeth and Oropharynx as per pre-operative assessment

## 2017-10-28 NOTE — Anesthesia Postprocedure Evaluation (Signed)
Anesthesia Post Note  Patient: David Gillespie  Procedure(s) Performed: RIGHT INGUINAL HERNIA REPAIR ERAS PATHWAY (Right Abdomen) INSERTION OF MESH (Right Abdomen)     Patient location during evaluation: PACU Anesthesia Type: General Level of consciousness: awake and alert Pain management: pain level controlled Vital Signs Assessment: post-procedure vital signs reviewed and stable Respiratory status: spontaneous breathing, nonlabored ventilation, respiratory function stable and patient connected to nasal cannula oxygen Cardiovascular status: blood pressure returned to baseline and stable Postop Assessment: no apparent nausea or vomiting Anesthetic complications: no    Last Vitals:  Vitals:   10/28/17 1300 10/28/17 1312  BP:  127/65  Pulse:  72  Resp:  15  Temp: (!) 36.1 C   SpO2:  96%    Last Pain:  Vitals:   10/28/17 1312  TempSrc:   PainSc: 4                  Montez Hageman

## 2017-10-28 NOTE — Anesthesia Preprocedure Evaluation (Signed)
Anesthesia Evaluation  Patient identified by MRN, date of birth, ID band Patient awake    Reviewed: Allergy & Precautions, NPO status , Patient's Chart, lab work & pertinent test results  Airway Mallampati: II  TM Distance: >3 FB Neck ROM: Full    Dental no notable dental hx.    Pulmonary neg pulmonary ROS, former smoker,    Pulmonary exam normal breath sounds clear to auscultation       Cardiovascular negative cardio ROS Normal cardiovascular exam Rhythm:Regular Rate:Normal     Neuro/Psych negative neurological ROS  negative psych ROS   GI/Hepatic negative GI ROS, Neg liver ROS,   Endo/Other  negative endocrine ROS  Renal/GU negative Renal ROS  negative genitourinary   Musculoskeletal negative musculoskeletal ROS (+)   Abdominal   Peds negative pediatric ROS (+)  Hematology negative hematology ROS (+)   Anesthesia Other Findings   Reproductive/Obstetrics negative OB ROS                             Anesthesia Physical Anesthesia Plan  ASA: II  Anesthesia Plan: General   Post-op Pain Management:  Regional for Post-op pain   Induction: Intravenous  PONV Risk Score and Plan: 3 and Ondansetron and Treatment may vary due to age or medical condition  Airway Management Planned: LMA and Oral ETT  Additional Equipment:   Intra-op Plan:   Post-operative Plan: Extubation in OR  Informed Consent: I have reviewed the patients History and Physical, chart, labs and discussed the procedure including the risks, benefits and alternatives for the proposed anesthesia with the patient or authorized representative who has indicated his/her understanding and acceptance.   Dental advisory given  Plan Discussed with: CRNA  Anesthesia Plan Comments:         Anesthesia Quick Evaluation

## 2017-10-28 NOTE — Anesthesia Procedure Notes (Signed)
Anesthesia Regional Block: TAP block   Pre-Anesthetic Checklist: ,, timeout performed, Correct Patient, Correct Site, Correct Laterality, Correct Procedure, Correct Position, site marked, Risks and benefits discussed,  Surgical consent,  Pre-op evaluation,  At surgeon's request and post-op pain management  Laterality: Right  Prep: Maximum Sterile Barrier Precautions used, chloraprep       Needles:  Injection technique: Single-shot  Needle Type: Echogenic Stimulator Needle     Needle Length: 10cm      Additional Needles:   Procedures:,,,, ultrasound used (permanent image in chart),,,,  Narrative:  Start time: 10/28/2017 10:23 AM End time: 10/28/2017 10:33 AM Injection made incrementally with aspirations every 5 mL.  Performed by: Personally  Anesthesiologist: Montez Hageman, MD  Additional Notes: Risks, benefits and alternative to block explained extensively.  Patient tolerated procedure well, without complications.

## 2017-10-28 NOTE — Transfer of Care (Signed)
Immediate Anesthesia Transfer of Care Note  Patient: David Gillespie  Procedure(s) Performed: RIGHT INGUINAL HERNIA REPAIR ERAS PATHWAY (Right Abdomen) INSERTION OF MESH (Right Abdomen)  Patient Location: PACU  Anesthesia Type:General and GA combined with regional for post-op pain  Level of Consciousness: awake, alert , oriented, drowsy and patient cooperative  Airway & Oxygen Therapy: Patient Spontanous Breathing and Patient connected to nasal cannula oxygen  Post-op Assessment: Report given to RN, Post -op Vital signs reviewed and stable and Patient moving all extremities X 4  Post vital signs: Reviewed and stable  Last Vitals:  Vitals Value Taken Time  BP 133/76 10/28/2017 12:00 PM  Temp 36.6 C 10/28/2017 12:00 PM  Pulse 65 10/28/2017 12:04 PM  Resp 20 10/28/2017 12:04 PM  SpO2 100 % 10/28/2017 12:04 PM  Vitals shown include unvalidated device data.  Last Pain:  Vitals:   10/28/17 1200  TempSrc:   PainSc: 4          Complications: No apparent anesthesia complications

## 2017-10-29 ENCOUNTER — Encounter (HOSPITAL_COMMUNITY): Payer: Self-pay | Admitting: General Surgery

## 2017-12-03 DIAGNOSIS — M9903 Segmental and somatic dysfunction of lumbar region: Secondary | ICD-10-CM | POA: Diagnosis not present

## 2017-12-03 DIAGNOSIS — M9905 Segmental and somatic dysfunction of pelvic region: Secondary | ICD-10-CM | POA: Diagnosis not present

## 2017-12-03 DIAGNOSIS — M9904 Segmental and somatic dysfunction of sacral region: Secondary | ICD-10-CM | POA: Diagnosis not present

## 2017-12-03 DIAGNOSIS — M5136 Other intervertebral disc degeneration, lumbar region: Secondary | ICD-10-CM | POA: Diagnosis not present

## 2018-01-14 ENCOUNTER — Ambulatory Visit (INDEPENDENT_AMBULATORY_CARE_PROVIDER_SITE_OTHER): Payer: PPO | Admitting: Otolaryngology

## 2018-01-14 DIAGNOSIS — H6123 Impacted cerumen, bilateral: Secondary | ICD-10-CM | POA: Diagnosis not present

## 2018-02-09 DIAGNOSIS — M9905 Segmental and somatic dysfunction of pelvic region: Secondary | ICD-10-CM | POA: Diagnosis not present

## 2018-02-09 DIAGNOSIS — M9903 Segmental and somatic dysfunction of lumbar region: Secondary | ICD-10-CM | POA: Diagnosis not present

## 2018-02-09 DIAGNOSIS — M5136 Other intervertebral disc degeneration, lumbar region: Secondary | ICD-10-CM | POA: Diagnosis not present

## 2018-02-09 DIAGNOSIS — M9904 Segmental and somatic dysfunction of sacral region: Secondary | ICD-10-CM | POA: Diagnosis not present

## 2018-03-01 DIAGNOSIS — M9904 Segmental and somatic dysfunction of sacral region: Secondary | ICD-10-CM | POA: Diagnosis not present

## 2018-03-01 DIAGNOSIS — M9905 Segmental and somatic dysfunction of pelvic region: Secondary | ICD-10-CM | POA: Diagnosis not present

## 2018-03-01 DIAGNOSIS — M5136 Other intervertebral disc degeneration, lumbar region: Secondary | ICD-10-CM | POA: Diagnosis not present

## 2018-03-01 DIAGNOSIS — M9903 Segmental and somatic dysfunction of lumbar region: Secondary | ICD-10-CM | POA: Diagnosis not present

## 2018-03-03 DIAGNOSIS — M5136 Other intervertebral disc degeneration, lumbar region: Secondary | ICD-10-CM | POA: Diagnosis not present

## 2018-03-03 DIAGNOSIS — M9905 Segmental and somatic dysfunction of pelvic region: Secondary | ICD-10-CM | POA: Diagnosis not present

## 2018-03-03 DIAGNOSIS — M9903 Segmental and somatic dysfunction of lumbar region: Secondary | ICD-10-CM | POA: Diagnosis not present

## 2018-03-03 DIAGNOSIS — M9904 Segmental and somatic dysfunction of sacral region: Secondary | ICD-10-CM | POA: Diagnosis not present

## 2018-04-01 DIAGNOSIS — N400 Enlarged prostate without lower urinary tract symptoms: Secondary | ICD-10-CM | POA: Diagnosis not present

## 2018-04-01 DIAGNOSIS — N529 Male erectile dysfunction, unspecified: Secondary | ICD-10-CM | POA: Diagnosis not present

## 2018-04-08 DIAGNOSIS — K635 Polyp of colon: Secondary | ICD-10-CM | POA: Diagnosis not present

## 2018-04-08 DIAGNOSIS — N401 Enlarged prostate with lower urinary tract symptoms: Secondary | ICD-10-CM | POA: Diagnosis not present

## 2018-09-28 DIAGNOSIS — H6123 Impacted cerumen, bilateral: Secondary | ICD-10-CM | POA: Diagnosis not present

## 2018-09-28 DIAGNOSIS — Z23 Encounter for immunization: Secondary | ICD-10-CM | POA: Diagnosis not present

## 2018-10-25 DIAGNOSIS — M9902 Segmental and somatic dysfunction of thoracic region: Secondary | ICD-10-CM | POA: Diagnosis not present

## 2018-10-25 DIAGNOSIS — M9903 Segmental and somatic dysfunction of lumbar region: Secondary | ICD-10-CM | POA: Diagnosis not present

## 2018-10-25 DIAGNOSIS — M545 Low back pain: Secondary | ICD-10-CM | POA: Diagnosis not present

## 2018-10-25 DIAGNOSIS — M9905 Segmental and somatic dysfunction of pelvic region: Secondary | ICD-10-CM | POA: Diagnosis not present

## 2018-10-29 DIAGNOSIS — M9903 Segmental and somatic dysfunction of lumbar region: Secondary | ICD-10-CM | POA: Diagnosis not present

## 2018-10-29 DIAGNOSIS — M9902 Segmental and somatic dysfunction of thoracic region: Secondary | ICD-10-CM | POA: Diagnosis not present

## 2018-10-29 DIAGNOSIS — M545 Low back pain: Secondary | ICD-10-CM | POA: Diagnosis not present

## 2018-10-29 DIAGNOSIS — M9905 Segmental and somatic dysfunction of pelvic region: Secondary | ICD-10-CM | POA: Diagnosis not present

## 2019-02-13 ENCOUNTER — Ambulatory Visit: Payer: PPO

## 2019-02-18 ENCOUNTER — Other Ambulatory Visit: Payer: PPO

## 2019-02-24 ENCOUNTER — Ambulatory Visit: Payer: PPO

## 2019-03-31 DIAGNOSIS — H6123 Impacted cerumen, bilateral: Secondary | ICD-10-CM | POA: Diagnosis not present

## 2019-04-25 DIAGNOSIS — N400 Enlarged prostate without lower urinary tract symptoms: Secondary | ICD-10-CM | POA: Diagnosis not present

## 2019-04-25 DIAGNOSIS — Z79899 Other long term (current) drug therapy: Secondary | ICD-10-CM | POA: Diagnosis not present

## 2019-04-25 DIAGNOSIS — Z125 Encounter for screening for malignant neoplasm of prostate: Secondary | ICD-10-CM | POA: Diagnosis not present

## 2019-04-25 DIAGNOSIS — N529 Male erectile dysfunction, unspecified: Secondary | ICD-10-CM | POA: Diagnosis not present

## 2019-05-02 DIAGNOSIS — R972 Elevated prostate specific antigen [PSA]: Secondary | ICD-10-CM | POA: Diagnosis not present

## 2019-05-02 DIAGNOSIS — Z0001 Encounter for general adult medical examination with abnormal findings: Secondary | ICD-10-CM | POA: Diagnosis not present

## 2019-05-02 DIAGNOSIS — N401 Enlarged prostate with lower urinary tract symptoms: Secondary | ICD-10-CM | POA: Diagnosis not present

## 2019-05-02 DIAGNOSIS — D126 Benign neoplasm of colon, unspecified: Secondary | ICD-10-CM | POA: Diagnosis not present

## 2019-06-08 DIAGNOSIS — M545 Low back pain: Secondary | ICD-10-CM | POA: Diagnosis not present

## 2019-06-08 DIAGNOSIS — M9905 Segmental and somatic dysfunction of pelvic region: Secondary | ICD-10-CM | POA: Diagnosis not present

## 2019-06-08 DIAGNOSIS — M9903 Segmental and somatic dysfunction of lumbar region: Secondary | ICD-10-CM | POA: Diagnosis not present

## 2019-06-08 DIAGNOSIS — M9902 Segmental and somatic dysfunction of thoracic region: Secondary | ICD-10-CM | POA: Diagnosis not present

## 2019-06-29 DIAGNOSIS — M9905 Segmental and somatic dysfunction of pelvic region: Secondary | ICD-10-CM | POA: Diagnosis not present

## 2019-06-29 DIAGNOSIS — M9903 Segmental and somatic dysfunction of lumbar region: Secondary | ICD-10-CM | POA: Diagnosis not present

## 2019-06-29 DIAGNOSIS — M545 Low back pain: Secondary | ICD-10-CM | POA: Diagnosis not present

## 2019-06-29 DIAGNOSIS — M9902 Segmental and somatic dysfunction of thoracic region: Secondary | ICD-10-CM | POA: Diagnosis not present

## 2019-07-01 DIAGNOSIS — R972 Elevated prostate specific antigen [PSA]: Secondary | ICD-10-CM | POA: Diagnosis not present

## 2019-07-04 DIAGNOSIS — M9905 Segmental and somatic dysfunction of pelvic region: Secondary | ICD-10-CM | POA: Diagnosis not present

## 2019-07-04 DIAGNOSIS — M545 Low back pain: Secondary | ICD-10-CM | POA: Diagnosis not present

## 2019-07-04 DIAGNOSIS — M9903 Segmental and somatic dysfunction of lumbar region: Secondary | ICD-10-CM | POA: Diagnosis not present

## 2019-07-04 DIAGNOSIS — M9902 Segmental and somatic dysfunction of thoracic region: Secondary | ICD-10-CM | POA: Diagnosis not present

## 2019-07-11 DIAGNOSIS — M9902 Segmental and somatic dysfunction of thoracic region: Secondary | ICD-10-CM | POA: Diagnosis not present

## 2019-07-11 DIAGNOSIS — M9905 Segmental and somatic dysfunction of pelvic region: Secondary | ICD-10-CM | POA: Diagnosis not present

## 2019-07-11 DIAGNOSIS — M545 Low back pain: Secondary | ICD-10-CM | POA: Diagnosis not present

## 2019-07-11 DIAGNOSIS — M9903 Segmental and somatic dysfunction of lumbar region: Secondary | ICD-10-CM | POA: Diagnosis not present

## 2019-08-03 DIAGNOSIS — H43812 Vitreous degeneration, left eye: Secondary | ICD-10-CM | POA: Diagnosis not present

## 2019-08-03 DIAGNOSIS — H353131 Nonexudative age-related macular degeneration, bilateral, early dry stage: Secondary | ICD-10-CM | POA: Diagnosis not present

## 2019-08-03 DIAGNOSIS — H2513 Age-related nuclear cataract, bilateral: Secondary | ICD-10-CM | POA: Diagnosis not present

## 2019-08-24 DIAGNOSIS — H43812 Vitreous degeneration, left eye: Secondary | ICD-10-CM | POA: Diagnosis not present

## 2019-09-02 DIAGNOSIS — M9903 Segmental and somatic dysfunction of lumbar region: Secondary | ICD-10-CM | POA: Diagnosis not present

## 2019-09-02 DIAGNOSIS — M9905 Segmental and somatic dysfunction of pelvic region: Secondary | ICD-10-CM | POA: Diagnosis not present

## 2019-09-02 DIAGNOSIS — M545 Low back pain: Secondary | ICD-10-CM | POA: Diagnosis not present

## 2019-09-02 DIAGNOSIS — M9902 Segmental and somatic dysfunction of thoracic region: Secondary | ICD-10-CM | POA: Diagnosis not present

## 2019-09-05 DIAGNOSIS — M545 Low back pain: Secondary | ICD-10-CM | POA: Diagnosis not present

## 2019-09-05 DIAGNOSIS — M9905 Segmental and somatic dysfunction of pelvic region: Secondary | ICD-10-CM | POA: Diagnosis not present

## 2019-09-05 DIAGNOSIS — M9902 Segmental and somatic dysfunction of thoracic region: Secondary | ICD-10-CM | POA: Diagnosis not present

## 2019-09-05 DIAGNOSIS — M9903 Segmental and somatic dysfunction of lumbar region: Secondary | ICD-10-CM | POA: Diagnosis not present

## 2019-09-09 DIAGNOSIS — M545 Low back pain: Secondary | ICD-10-CM | POA: Diagnosis not present

## 2019-09-09 DIAGNOSIS — M9902 Segmental and somatic dysfunction of thoracic region: Secondary | ICD-10-CM | POA: Diagnosis not present

## 2019-09-09 DIAGNOSIS — M9905 Segmental and somatic dysfunction of pelvic region: Secondary | ICD-10-CM | POA: Diagnosis not present

## 2019-09-09 DIAGNOSIS — M9903 Segmental and somatic dysfunction of lumbar region: Secondary | ICD-10-CM | POA: Diagnosis not present

## 2019-09-14 DIAGNOSIS — R194 Change in bowel habit: Secondary | ICD-10-CM | POA: Diagnosis not present

## 2019-09-14 DIAGNOSIS — F419 Anxiety disorder, unspecified: Secondary | ICD-10-CM | POA: Diagnosis not present

## 2019-09-14 DIAGNOSIS — Z86010 Personal history of colonic polyps: Secondary | ICD-10-CM | POA: Diagnosis not present

## 2019-10-05 DIAGNOSIS — H6123 Impacted cerumen, bilateral: Secondary | ICD-10-CM | POA: Diagnosis not present

## 2019-10-24 DIAGNOSIS — Z23 Encounter for immunization: Secondary | ICD-10-CM | POA: Diagnosis not present

## 2020-02-10 DIAGNOSIS — M9903 Segmental and somatic dysfunction of lumbar region: Secondary | ICD-10-CM | POA: Diagnosis not present

## 2020-02-10 DIAGNOSIS — M9905 Segmental and somatic dysfunction of pelvic region: Secondary | ICD-10-CM | POA: Diagnosis not present

## 2020-02-10 DIAGNOSIS — M62830 Muscle spasm of back: Secondary | ICD-10-CM | POA: Diagnosis not present

## 2020-02-10 DIAGNOSIS — M9902 Segmental and somatic dysfunction of thoracic region: Secondary | ICD-10-CM | POA: Diagnosis not present

## 2020-02-15 DIAGNOSIS — M62830 Muscle spasm of back: Secondary | ICD-10-CM | POA: Diagnosis not present

## 2020-02-15 DIAGNOSIS — M9903 Segmental and somatic dysfunction of lumbar region: Secondary | ICD-10-CM | POA: Diagnosis not present

## 2020-02-15 DIAGNOSIS — M9902 Segmental and somatic dysfunction of thoracic region: Secondary | ICD-10-CM | POA: Diagnosis not present

## 2020-02-15 DIAGNOSIS — M9905 Segmental and somatic dysfunction of pelvic region: Secondary | ICD-10-CM | POA: Diagnosis not present

## 2020-02-24 DIAGNOSIS — M9903 Segmental and somatic dysfunction of lumbar region: Secondary | ICD-10-CM | POA: Diagnosis not present

## 2020-02-24 DIAGNOSIS — M62830 Muscle spasm of back: Secondary | ICD-10-CM | POA: Diagnosis not present

## 2020-02-24 DIAGNOSIS — M9902 Segmental and somatic dysfunction of thoracic region: Secondary | ICD-10-CM | POA: Diagnosis not present

## 2020-02-24 DIAGNOSIS — M9905 Segmental and somatic dysfunction of pelvic region: Secondary | ICD-10-CM | POA: Diagnosis not present

## 2020-04-04 DIAGNOSIS — H6123 Impacted cerumen, bilateral: Secondary | ICD-10-CM | POA: Diagnosis not present

## 2020-04-23 DIAGNOSIS — U071 COVID-19: Secondary | ICD-10-CM | POA: Diagnosis not present

## 2020-05-01 DIAGNOSIS — R972 Elevated prostate specific antigen [PSA]: Secondary | ICD-10-CM | POA: Diagnosis not present

## 2020-05-01 DIAGNOSIS — Z79899 Other long term (current) drug therapy: Secondary | ICD-10-CM | POA: Diagnosis not present

## 2020-05-01 DIAGNOSIS — R7301 Impaired fasting glucose: Secondary | ICD-10-CM | POA: Diagnosis not present

## 2020-05-01 DIAGNOSIS — N401 Enlarged prostate with lower urinary tract symptoms: Secondary | ICD-10-CM | POA: Diagnosis not present

## 2020-05-04 DIAGNOSIS — I493 Ventricular premature depolarization: Secondary | ICD-10-CM | POA: Diagnosis not present

## 2020-05-04 DIAGNOSIS — R7301 Impaired fasting glucose: Secondary | ICD-10-CM | POA: Diagnosis not present

## 2020-05-04 DIAGNOSIS — N401 Enlarged prostate with lower urinary tract symptoms: Secondary | ICD-10-CM | POA: Diagnosis not present

## 2020-05-04 DIAGNOSIS — Z6821 Body mass index (BMI) 21.0-21.9, adult: Secondary | ICD-10-CM | POA: Diagnosis not present

## 2020-10-03 DIAGNOSIS — H6123 Impacted cerumen, bilateral: Secondary | ICD-10-CM | POA: Diagnosis not present

## 2020-10-10 DIAGNOSIS — Z23 Encounter for immunization: Secondary | ICD-10-CM | POA: Diagnosis not present

## 2021-02-10 DIAGNOSIS — N400 Enlarged prostate without lower urinary tract symptoms: Secondary | ICD-10-CM | POA: Diagnosis not present

## 2021-02-10 DIAGNOSIS — N529 Male erectile dysfunction, unspecified: Secondary | ICD-10-CM | POA: Diagnosis not present

## 2021-03-25 DIAGNOSIS — M9903 Segmental and somatic dysfunction of lumbar region: Secondary | ICD-10-CM | POA: Diagnosis not present

## 2021-03-25 DIAGNOSIS — M9902 Segmental and somatic dysfunction of thoracic region: Secondary | ICD-10-CM | POA: Diagnosis not present

## 2021-03-25 DIAGNOSIS — M62830 Muscle spasm of back: Secondary | ICD-10-CM | POA: Diagnosis not present

## 2021-03-25 DIAGNOSIS — M9905 Segmental and somatic dysfunction of pelvic region: Secondary | ICD-10-CM | POA: Diagnosis not present

## 2021-03-29 DIAGNOSIS — H6123 Impacted cerumen, bilateral: Secondary | ICD-10-CM | POA: Diagnosis not present

## 2021-04-01 DIAGNOSIS — M62830 Muscle spasm of back: Secondary | ICD-10-CM | POA: Diagnosis not present

## 2021-04-01 DIAGNOSIS — M9902 Segmental and somatic dysfunction of thoracic region: Secondary | ICD-10-CM | POA: Diagnosis not present

## 2021-04-01 DIAGNOSIS — M9903 Segmental and somatic dysfunction of lumbar region: Secondary | ICD-10-CM | POA: Diagnosis not present

## 2021-04-01 DIAGNOSIS — M9905 Segmental and somatic dysfunction of pelvic region: Secondary | ICD-10-CM | POA: Diagnosis not present

## 2021-04-08 DIAGNOSIS — M9903 Segmental and somatic dysfunction of lumbar region: Secondary | ICD-10-CM | POA: Diagnosis not present

## 2021-04-08 DIAGNOSIS — M9905 Segmental and somatic dysfunction of pelvic region: Secondary | ICD-10-CM | POA: Diagnosis not present

## 2021-04-08 DIAGNOSIS — M62830 Muscle spasm of back: Secondary | ICD-10-CM | POA: Diagnosis not present

## 2021-04-08 DIAGNOSIS — M9902 Segmental and somatic dysfunction of thoracic region: Secondary | ICD-10-CM | POA: Diagnosis not present

## 2021-04-29 DIAGNOSIS — R7301 Impaired fasting glucose: Secondary | ICD-10-CM | POA: Diagnosis not present

## 2021-04-29 DIAGNOSIS — Z125 Encounter for screening for malignant neoplasm of prostate: Secondary | ICD-10-CM | POA: Diagnosis not present

## 2021-05-06 DIAGNOSIS — I480 Paroxysmal atrial fibrillation: Secondary | ICD-10-CM | POA: Diagnosis not present

## 2021-05-06 DIAGNOSIS — Z6820 Body mass index (BMI) 20.0-20.9, adult: Secondary | ICD-10-CM | POA: Diagnosis not present

## 2021-05-06 DIAGNOSIS — N400 Enlarged prostate without lower urinary tract symptoms: Secondary | ICD-10-CM | POA: Diagnosis not present

## 2021-05-06 DIAGNOSIS — S61012A Laceration without foreign body of left thumb without damage to nail, initial encounter: Secondary | ICD-10-CM | POA: Diagnosis not present

## 2021-05-06 DIAGNOSIS — R7309 Other abnormal glucose: Secondary | ICD-10-CM | POA: Diagnosis not present

## 2021-09-27 DIAGNOSIS — H6123 Impacted cerumen, bilateral: Secondary | ICD-10-CM | POA: Diagnosis not present

## 2021-10-13 DIAGNOSIS — M79671 Pain in right foot: Secondary | ICD-10-CM | POA: Insufficient documentation

## 2021-11-11 ENCOUNTER — Encounter: Payer: Self-pay | Admitting: Student

## 2021-11-15 ENCOUNTER — Ambulatory Visit: Payer: PPO | Admitting: Student

## 2021-11-15 ENCOUNTER — Other Ambulatory Visit: Payer: Self-pay | Admitting: *Deleted

## 2021-11-15 ENCOUNTER — Encounter: Payer: Self-pay | Admitting: Student

## 2021-11-15 VITALS — BP 132/78 | HR 78 | Temp 97.9°F | Ht 71.0 in | Wt 147.0 lb

## 2021-11-15 DIAGNOSIS — Z0000 Encounter for general adult medical examination without abnormal findings: Secondary | ICD-10-CM | POA: Diagnosis not present

## 2021-11-15 DIAGNOSIS — K5730 Diverticulosis of large intestine without perforation or abscess without bleeding: Secondary | ICD-10-CM

## 2021-11-15 DIAGNOSIS — Z860100 Personal history of colon polyps, unspecified: Secondary | ICD-10-CM | POA: Insufficient documentation

## 2021-11-15 DIAGNOSIS — F419 Anxiety disorder, unspecified: Secondary | ICD-10-CM | POA: Insufficient documentation

## 2021-11-15 DIAGNOSIS — I83891 Varicose veins of right lower extremities with other complications: Secondary | ICD-10-CM | POA: Diagnosis not present

## 2021-11-15 NOTE — Progress Notes (Signed)
Location:   [facility]   Place of Service:   [facility]  Provider: Unk Lightning  Code Status: Full Code Goals of Care:     11/15/2021    1:09 PM  Advanced Directives  Does Patient Have a Medical Advance Directive? Yes  Type of Paramedic of [place];Living will  Does patient want to make changes to medical advance directive? No - Patient declined  Copy of [place] in Chart? No - copy requested  Would patient like information on creating a medical advance directive? No - Patient declined     Chief Complaint  Patient presents with   Establish Care    NP to establish. Complains Right Foot Pain.    HPI: Patient is a 71 y.o. male seen today for medical management of chronic diseases.    His health is 9/10. He gets seen 1x per year for physical - would like to have his physical with me in May 22, 2022. Periodically his PSA has been elevated and he was sent to urology. Doesn't go to the doctor often.  Atrial fibrillation - he may have been told that 35 years ago.  Back pain is intermittent - nothing in years  BPH - previously followed by urology   His foot right now is the issue. He has an area on the right foot that protrudes out. It went away on its own in about a week. And then when he stands the balls of his feet will hurt. It can make him yell out with pain. In 2007, he broke a bone in his right foot. He went to orthopedics and was in a boot for a long time. He never did therapy afterwards. Right foot "ugly foot" he now has lots of vericose veins. No history of blood clot. No injury since that fracture. When he is wearing shoes he has no issues with walking, but when the shoes come off it's intense pain.   He is from [place]. And has been between [place] and here. He has been at [facility] since June 2023. They were on the list since 2019. 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 He has been  retired since he was 71 years old. He fishes a lot. For exercise he has two dogs that he walks. He does MWF TRX. TTh at 3PM he does core at [place].   His Wife Bethena Roys walks with him. They have been married 25 years. No children. His brother passed away last 05-22-2022 from complications from a urostomy sepsis; SIL is in [place].    Eye doctor annually and ear doctor q6 mo.   Past Medical History:  Diagnosis Date   Anxiety    Arthritis    lower back    Past Surgical History:  Procedure Laterality Date   INGUINAL HERNIA REPAIR Right 10/28/2017   Procedure: RIGHT INGUINAL HERNIA REPAIR ERAS PATHWAY;  Surgeon: Rolm Bookbinder, MD;  Location: [facility];  Service: General;  Laterality: Right;  TAP BLOCK   INSERTION OF MESH Right 10/28/2017   Procedure: INSERTION OF MESH;  Surgeon: Rolm Bookbinder, MD;  Location: [facility];  Service: General;  Laterality: Right;   SMALL INTESTINE SURGERY     TONSILLECTOMY      No Known Allergies  Outpatient Encounter Medications as of 11/15/2021  Medication Sig   Coenzyme Q10 (COQ10) 100 MG CAPS Take 100 mg by mouth daily.   Misc Natural  Products (OSTEO BI-FLEX TRIPLE STRENGTH PO) Take 1 tablet by mouth daily.   Multiple Vitamins-Minerals (SENIOR MULTIVITAMIN PLUS PO) Take 1 tablet by mouth daily.   [DISCONTINUED] oxyCODONE (OXY IR/ROXICODONE) 5 MG immediate release tablet Take 1-2 tablets (5-10 mg total) by mouth every 6 (six) hours as needed for moderate pain, severe pain or breakthrough pain.   No facility-administered encounter medications on file as of 11/15/2021.    Review of Systems:  Review of Systems  All other systems reviewed and are negative.   Health Maintenance  Topic Date Due   Hepatitis C Screening  Never done   TETANUS/TDAP  Never done   Pneumonia Vaccine 75+ Years old (1 - PCV) Never done   Zoster Vaccines- Shingrix (2 of 2) 10/22/2021   Medicare Annual Wellness (AWV)  02/10/2022   INFLUENZA VACCINE  Completed   COVID-19 Vaccine   Completed   HPV VACCINES  Aged Out   COLONOSCOPY (Pts 45-44yr Insurance coverage will need to be confirmed)  Discontinued    Physical Exam: Vitals:   11/15/21 1305  BP: 132/78  Pulse: 78  Temp: 97.9 F (36.6 C)  SpO2: 97%  Weight: 147 lb (66.7 kg)  Height: '5\' 11"'$  (1.803 m)   Body mass index is 20.5 kg/m. Physical Exam Vitals reviewed.  Constitutional:      Appearance: Normal appearance. He is normal weight.  Cardiovascular:     Rate and Rhythm: Normal rate and regular rhythm.     Pulses: Normal pulses.     Heart sounds: Normal heart sounds.  Pulmonary:     Effort: Pulmonary effort is normal.     Breath sounds: Normal breath sounds.  Abdominal:     General: Abdomen is flat. Bowel sounds are normal.     Palpations: Abdomen is soft.  Musculoskeletal:     Comments: Trace RLE edema. Left normal.   Skin:    General: Skin is warm and dry.     Capillary Refill: Capillary refill takes less than 2 seconds.     Comments: Right foot red in color. Visible tortuous veins present in foot and ankle. 2+ pulses bilaterally for DP and PTA. Proprioception intact bilateral. Sensation to light touch.   Neurological:     General: No focal deficit present.     Mental Status: He is alert and oriented to person, place, and time. Mental status is at baseline.     Labs reviewed: Basic Metabolic Panel: No results for input(s): "NA", "K", "CL", "CO2", "GLUCOSE", "BUN", "CREATININE", "CALCIUM", "MG", "PHOS", "TSH" in the last 8760 hours. Liver Function Tests: No results for input(s): "AST", "ALT", "ALKPHOS", "BILITOT", "PROT", "ALBUMIN" in the last 8760 hours. No results for input(s): "LIPASE", "AMYLASE" in the last 8760 hours. No results for input(s): "AMMONIA" in the last 8760 hours. CBC: No results for input(s): "WBC", "NEUTROABS", "HGB", "HCT", "MCV", "PLT" in the last 8760 hours. Lipid Panel: No results for input(s): "CHOL", "HDL", "LDLCALC", "TRIG", "CHOLHDL", "LDLDIRECT" in the last  8760 hours. No results found for: "HGBA1C"  Procedures since last visit: No results found.  Assessment/Plan 1. Encounter for medical examination to establish care Patient is in good health per personal report. Unable to read his records. Will request them with MA. Denies symptoms at this time of memory changes, hearing changes, or vision changes. He lives independently with his wife. No falls. No assistive devices. Will need labs at annual physical in April. Will determine need for AWV. Declines future colonoscopies. Due for Tetanus - scheduled to receive.  Due for Hep C screening-- however, this may be in his records that will be reviewed. After review of outside records will determine need for additional vaccinations as outlined.   2. Varicose veins of leg with swelling, right Patient had a fracture many years ago and has had changes in foot health sense. Endorses needle-like pain when walking barefoot on the right side. Visible tortuous veins on the surface. 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 Discussed after evaluation with ultrasound, can start compression stockings and have evaluation by vascular consultants. Given difference in skin color, its' probably a stage three but will need to have further evaluation with venous doppler. Ordered doppler today and vascular consult. Will call with [place] results.   3. Diverticulosis of colon Hx however no treatment or concerns today. Will CTM.   4. Personal history of colonic polyps Patient without recollection. No symptoms at this time. Declines future colonoscopies at this time. CTM.     Labs/tests ordered:  * No order type specified * Next appt:  04/2022  Tomasa Rand, MD, [place] [phone]

## 2021-11-15 NOTE — Patient Instructions (Addendum)
Nice to meet you today!   You should receive a call in the next 48 business hours to schedule your appointment for your ultrasound.   Please also schedule your appointment with [facility].   I'll call you once I receive your results. Otherwise I will see you in April.

## 2021-11-22 ENCOUNTER — Ambulatory Visit
Admission: RE | Admit: 2021-11-22 | Discharge: 2021-11-22 | Disposition: A | Discharge status: Home / Self Care | Payer: PPO | Source: Ambulatory Visit | Attending: Student | Admitting: Student

## 2021-11-22 ENCOUNTER — Telehealth: Payer: Self-pay | Admitting: *Deleted

## 2021-11-22 DIAGNOSIS — I83891 Varicose veins of right lower extremities with other complications: Secondary | ICD-10-CM | POA: Diagnosis not present

## 2021-11-22 DIAGNOSIS — M7989 Other specified soft tissue disorders: Secondary | ICD-10-CM | POA: Diagnosis not present

## 2021-11-22 NOTE — Telephone Encounter (Signed)
Patient showed up in the [facility] stating that he was seen 11/3 and Dr. Unk Lightning placed a referral for him to a Vascular Dr and he has not heard anything yet.   Message sent to Harle Battiest with Referrals checking the status.   Awaiting Response.

## 2021-11-22 NOTE — Telephone Encounter (Signed)
Dr. Unk Lightning received the test results back and it was NEGATIVE for DVT per Dr. Unk Lightning.   I called patient with results and also gave him the number to [facility] [phone] to call to schedule an appointment to follow up per Dr. Unk Lightning.   Patient notified and agreed.

## 2021-11-22 NOTE — Telephone Encounter (Signed)
Appointment scheduled with [facility] at [facility] [phone] [place] by [employer].   Appointment: 11/22/2021 @ 12:45  Patient Notified and agreed.

## 2021-11-25 NOTE — Addendum Note (Signed)
Addended by: Dewayne Shorter on: 11/25/2021 02:59 PM   Modules accepted: Level of Service

## 2021-11-27 DIAGNOSIS — M9903 Segmental and somatic dysfunction of lumbar region: Secondary | ICD-10-CM | POA: Diagnosis not present

## 2021-11-27 DIAGNOSIS — M5136 Other intervertebral disc degeneration, lumbar region: Secondary | ICD-10-CM | POA: Diagnosis not present

## 2021-11-27 DIAGNOSIS — M9904 Segmental and somatic dysfunction of sacral region: Secondary | ICD-10-CM | POA: Diagnosis not present

## 2021-11-27 DIAGNOSIS — M9905 Segmental and somatic dysfunction of pelvic region: Secondary | ICD-10-CM | POA: Diagnosis not present

## 2021-11-28 DIAGNOSIS — M5451 Vertebrogenic low back pain: Secondary | ICD-10-CM | POA: Diagnosis not present

## 2021-11-28 DIAGNOSIS — M7918 Myalgia, other site: Secondary | ICD-10-CM | POA: Diagnosis not present

## 2021-11-28 DIAGNOSIS — M9904 Segmental and somatic dysfunction of sacral region: Secondary | ICD-10-CM | POA: Diagnosis not present

## 2021-11-28 DIAGNOSIS — M9903 Segmental and somatic dysfunction of lumbar region: Secondary | ICD-10-CM | POA: Diagnosis not present

## 2021-12-03 DIAGNOSIS — M9904 Segmental and somatic dysfunction of sacral region: Secondary | ICD-10-CM | POA: Diagnosis not present

## 2021-12-03 DIAGNOSIS — M5451 Vertebrogenic low back pain: Secondary | ICD-10-CM | POA: Diagnosis not present

## 2021-12-03 DIAGNOSIS — M7918 Myalgia, other site: Secondary | ICD-10-CM | POA: Diagnosis not present

## 2021-12-03 DIAGNOSIS — M9903 Segmental and somatic dysfunction of lumbar region: Secondary | ICD-10-CM | POA: Diagnosis not present

## 2021-12-30 DIAGNOSIS — M9903 Segmental and somatic dysfunction of lumbar region: Secondary | ICD-10-CM | POA: Diagnosis not present

## 2021-12-30 DIAGNOSIS — M9904 Segmental and somatic dysfunction of sacral region: Secondary | ICD-10-CM | POA: Diagnosis not present

## 2021-12-30 DIAGNOSIS — M7918 Myalgia, other site: Secondary | ICD-10-CM | POA: Diagnosis not present

## 2021-12-30 DIAGNOSIS — M5451 Vertebrogenic low back pain: Secondary | ICD-10-CM | POA: Diagnosis not present

## 2022-01-01 ENCOUNTER — Ambulatory Visit (INDEPENDENT_AMBULATORY_CARE_PROVIDER_SITE_OTHER): Payer: PPO | Admitting: Nurse Practitioner

## 2022-01-01 ENCOUNTER — Encounter (INDEPENDENT_AMBULATORY_CARE_PROVIDER_SITE_OTHER): Payer: Self-pay | Admitting: Nurse Practitioner

## 2022-01-01 VITALS — BP 125/67 | HR 69 | Resp 16 | Wt 146.0 lb

## 2022-01-01 DIAGNOSIS — M79671 Pain in right foot: Secondary | ICD-10-CM

## 2022-01-01 DIAGNOSIS — I872 Venous insufficiency (chronic) (peripheral): Secondary | ICD-10-CM | POA: Diagnosis not present

## 2022-01-13 ENCOUNTER — Encounter (INDEPENDENT_AMBULATORY_CARE_PROVIDER_SITE_OTHER): Payer: Self-pay | Admitting: Nurse Practitioner

## 2022-01-13 NOTE — Progress Notes (Signed)
Subjective:    Patient ID: David Gillespie, male    DOB: 1950/05/27, 72 y.o.   MRN: [id number] Chief Complaint  Patient presents with   New Patient (Initial Visit)    Ref [place] consult varicose veins    David Gillespie is a 72 year old male who presents today as a referral from Dr. Unk Lightning in regards to right foot pain.  The patient broke this foot in 2007.  The last few months this foot has been slightly more swollen than the left that he notes that the color is darker.  He notes that approximately 10 to 15 feet he begins to have limping and hurting whenever he is barefooted.  However he notes that this does not occur whenever he has shoes on.  The pain occurs in the pad of his foot and around the toe area.  He does have some notable varicosities. There are no open wounds or ulcerations.  He denies any symptoms of claudication.    Review of Systems  Cardiovascular:  Positive for leg swelling.  Musculoskeletal:  Positive for gait problem.  Skin:  Positive for color change.  All other systems reviewed and are negative.      Objective:   Physical Exam Vitals reviewed.  HENT:     Head: Normocephalic.  Cardiovascular:     Rate and Rhythm: Normal rate.     Pulses: Normal pulses.  Pulmonary:     Effort: Pulmonary effort is normal.  Musculoskeletal:     Right lower leg: Edema present.  Skin:    General: Skin is warm and dry.  Neurological:     Mental Status: He is alert and oriented to person, place, and time.  Psychiatric:        Mood and Affect: Mood normal.        Behavior: Behavior normal.        Thought Content: Thought content normal.        Judgment: Judgment normal.     BP 125/67 (BP Location: Left Arm)   Pulse 69   Resp 16   Wt 146 lb (66.2 kg)   BMI 20.36 kg/m   Past Medical History:  Diagnosis Date   Anxiety    Arthritis    lower back    Social History   Socioeconomic History   Marital status: Married    Spouse name: Not on file   Number of  children: Not on file   Years of education: Not on file   Highest education level: Not on file  Occupational History   Not on file  Tobacco Use   Smoking status: Former    Types: Cigarettes    Quit date: 10/24/2015    Years since quitting: 6.2   Smokeless tobacco: Never  Vaping Use   Vaping Use: Former  Substance and Sexual Activity   Alcohol use: Yes    Comment: nightly   Drug use: Never   Sexual activity: Not on file  Other Topics Concern   Not on file  Social History Narrative   Not on file   Social Determinants of Health   Financial Resource Strain: Not on file  Food Insecurity: Not on file  Transportation Needs: Not on file  Physical Activity: Not on file  Stress: Not on file  Social Connections: Not on file  Intimate Partner Violence: Not on file    Past Surgical History:  Procedure Laterality Date   INGUINAL HERNIA REPAIR Right 10/28/2017   Procedure: [place]  ERAS PATHWAY;  Surgeon: Rolm Bookbinder, MD;  Location: [facility];  Service: General;  Laterality: Right;  TAP BLOCK   INSERTION OF MESH Right 10/28/2017   Procedure: INSERTION OF MESH;  Surgeon: Rolm Bookbinder, MD;  Location: [facility];  Service: General;  Laterality: Right;   SMALL INTESTINE SURGERY     TONSILLECTOMY      Family History  Problem Relation Age of Onset   Hip fracture Mother    Stroke Father     No Known Allergies     Latest Ref Rng & Units 10/23/2017   11:04 AM  CBC  WBC 4.0 - 10.5 K/uL 3.9   Hemoglobin 13.0 - 17.0 g/dL 14.2   Hematocrit 39.0 - 52.0 % 45.0   Platelets 150 - 400 K/uL 261       CMP     Component Value Date/Time   NA 138 10/23/2017 1104   K 4.5 10/23/2017 1104   CL 106 10/23/2017 1104   CO2 26 10/23/2017 1104   GLUCOSE 105 (H) 10/23/2017 1104   BUN 12 10/23/2017 1104   CREATININE 0.81 10/23/2017 1104   CALCIUM 9.4 10/23/2017 1104   GFRNONAA >60 10/23/2017 1104   GFRAA >60 10/23/2017 1104     No results found.      Assessment & Plan:   1. Right foot pain Based upon the patient's description of foot pain I suspect is more of the plantar fascia there is a possible neuroma.  The patient's pain is mostly on the plantar surface and typically varicosities would not affect this area.  He would have more pain on the dorsal surface if it were truly his varicosities causing the pain.  We will send the patient a referral to podiatry for further evaluation of pain as well.  2. Venous (peripheral) insufficiency The patient does have swelling and discoloration of the right lower extremity which certainly could be a result of venous insufficiency.  Previous DVT study was negative.  Will have the patient return at his convenience for bilateral venous reflux studies.  The patient will engage in conservative therapy including use of medical grade compression, elevation and activity as tolerable in the interim. - Ambulatory referral to Podiatry   Current Outpatient Medications on File Prior to Visit  Medication Sig Dispense Refill   Coenzyme Q10 (COQ10) 100 MG CAPS Take 100 mg by mouth daily.     Misc Natural Products (OSTEO BI-FLEX TRIPLE STRENGTH PO) Take 1 tablet by mouth daily.     Multiple Vitamins-Minerals (SENIOR MULTIVITAMIN PLUS PO) Take 1 tablet by mouth daily.     No current facility-administered medications on file prior to visit.    There are no Patient Instructions on file for this visit. No follow-ups on file.   Kris Hartmann, NP

## 2022-01-20 ENCOUNTER — Ambulatory Visit: Payer: PPO | Admitting: Podiatry

## 2022-01-20 ENCOUNTER — Encounter: Payer: Self-pay | Admitting: Podiatry

## 2022-01-20 DIAGNOSIS — Z1211 Encounter for screening for malignant neoplasm of colon: Secondary | ICD-10-CM | POA: Insufficient documentation

## 2022-01-20 DIAGNOSIS — R194 Change in bowel habit: Secondary | ICD-10-CM | POA: Insufficient documentation

## 2022-01-20 DIAGNOSIS — G5761 Lesion of plantar nerve, right lower limb: Secondary | ICD-10-CM | POA: Diagnosis not present

## 2022-01-20 DIAGNOSIS — M7741 Metatarsalgia, right foot: Secondary | ICD-10-CM | POA: Diagnosis not present

## 2022-01-21 NOTE — Progress Notes (Signed)
  Subjective:  Patient ID: David Gillespie, male    DOB: April 14, 1950,  MRN: [id number]  Chief Complaint  Patient presents with   venous insufficency     NP    Venous (peripheral) insufficiency    72 y.o. male presents with the above complaint. History confirmed with patient.  Issues with swelling and is seeing vascular surgery for this they recommend compression stockings and elevation and lifestyle modifications.  He complains primarily of pain in the ball of the foot on the right side, he had an injury to his metatarsals years ago and thinks this may be related  Objective:  Physical Exam: warm, good capillary refill, no trophic changes or ulcerative lesions, normal DP and PT pulses, normal sensory exam, venous insufficiency noted, and sharp pain on palpation to the plantar forefoot worst at the second interspace, slight radiation into toes, he has thinning of the metatarsal fat pad  Assessment:   1. 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  I reviewed my clinical exam findings with the patient I discussed with him that he likely has metatarsalgia secondary to thinning of the metatarsal fat pad possibly a neuroma of the second interspace.  We discussed treatment of this including offloading and possible injection therapy. I recommended offloading with a silicone metatarsal pad sleeve and we discussed shoe gear.  Currently for him it is mainly only painful when he walks barefoot and advised him to avoid going barefoot is much as possible.  He will return if it worsens for injection  Return if symptoms worsen or fail to improve.

## 2022-01-23 ENCOUNTER — Other Ambulatory Visit (INDEPENDENT_AMBULATORY_CARE_PROVIDER_SITE_OTHER): Payer: Self-pay | Admitting: Nurse Practitioner

## 2022-01-23 DIAGNOSIS — I83891 Varicose veins of right lower extremities with other complications: Secondary | ICD-10-CM

## 2022-02-03 ENCOUNTER — Encounter (INDEPENDENT_AMBULATORY_CARE_PROVIDER_SITE_OTHER): Payer: Self-pay | Admitting: Nurse Practitioner

## 2022-02-03 ENCOUNTER — Ambulatory Visit (INDEPENDENT_AMBULATORY_CARE_PROVIDER_SITE_OTHER): Payer: PPO

## 2022-02-03 ENCOUNTER — Ambulatory Visit (INDEPENDENT_AMBULATORY_CARE_PROVIDER_SITE_OTHER): Payer: PPO | Admitting: Nurse Practitioner

## 2022-02-03 VITALS — BP 143/73 | HR 62 | Resp 16 | Wt 147.6 lb

## 2022-02-03 DIAGNOSIS — M79671 Pain in right foot: Secondary | ICD-10-CM | POA: Diagnosis not present

## 2022-02-03 DIAGNOSIS — I872 Venous insufficiency (chronic) (peripheral): Secondary | ICD-10-CM | POA: Diagnosis not present

## 2022-02-03 NOTE — Progress Notes (Signed)
Subjective:    Patient ID: David Gillespie, male    DOB: September 06, 1950, 72 y.o.   MRN: [id number] Chief Complaint  Patient presents with   Follow-up    Ultrasound follow up    David Gillespie is a 72 year old male who presents today as a referral from Dr. Unk Lightning in regards to right foot pain.  The patient broke this foot in 2007.  The last few months this foot has been slightly more swollen than the left that he notes that the color is darker.  He notes that approximately 10 to 15 feet he begins to have limping and hurting whenever he is barefooted.  However he notes that this does not occur whenever he has shoes on.  The pain occurs in the pad of his foot and around the toe area.  He does have some notable varicosities.There are no open wounds or ulcerations.  He denies any symptoms of claudication.  Since his last office visit the patient did visit podiatry.  It was found that he had a metatarsalgia of the right foot as well as a likely neuroma of the second interspace of the right foot.  Today the patient underwent noninvasive studies which showed no evidence of DVT or superficial thrombophlebitis in the right lower extremity.  No evidence of superficial venous reflux but there is some deep venous insufficiency noted.  Is a very small amount in the common femoral vein.    Review of Systems  Cardiovascular:  Positive for leg swelling.  Musculoskeletal:  Positive for gait problem.  Skin:  Positive for color change.  All other systems reviewed and are negative.      Objective:   Physical Exam Vitals reviewed.  HENT:     Head: Normocephalic.  Cardiovascular:     Rate and Rhythm: Normal rate.     Pulses: Normal pulses.  Pulmonary:     Effort: Pulmonary effort is normal.  Musculoskeletal:     Right lower leg: Edema present.  Skin:    General: Skin is warm and dry.  Neurological:     Mental Status: He is alert and oriented to person, place, and time.  Psychiatric:        Mood and  Affect: Mood normal.        Behavior: Behavior normal.        Thought Content: Thought content normal.        Judgment: Judgment normal.     BP (!) 143/73 (BP Location: Left Arm)   Pulse 62   Resp 16   Wt 147 lb 9.6 oz (67 kg)   BMI 20.59 kg/m   Past Medical History:  Diagnosis Date   Anxiety    Arthritis    lower back    Social History   Socioeconomic History   Marital status: Married    Spouse name: Not on file   Number of children: Not on file   Years of education: Not on file   Highest education level: Not on file  Occupational History   Not on file  Tobacco Use   Smoking status: Former    Types: Cigarettes    Quit date: 10/24/2015    Years since quitting: 6.2   Smokeless tobacco: Never  Vaping Use   Vaping Use: Former  Substance and Sexual Activity   Alcohol use: Yes    Comment: nightly   Drug use: Never   Sexual activity: Not on file  Other Topics Concern   Not on file  Social History Narrative   Not on file   Social Determinants of Health   Financial Resource Strain: Not on file  Food Insecurity: Not on file  Transportation Needs: Not on file  Physical Activity: Not on file  Stress: Not on file  Social Connections: Not on file  Intimate Partner Violence: Not on file    Past Surgical History:  Procedure Laterality Date   INGUINAL HERNIA REPAIR Right 10/28/2017   Procedure: [place];  Surgeon: Rolm Bookbinder, MD;  Location: [facility];  Service: General;  Laterality: Right;  TAP BLOCK   INSERTION OF MESH Right 10/28/2017   Procedure: INSERTION OF MESH;  Surgeon: Rolm Bookbinder, MD;  Location: [place];  Service: General;  Laterality: Right;   SMALL INTESTINE SURGERY     TONSILLECTOMY      Family History  Problem Relation Age of Onset   Hip fracture Mother    Stroke Father     No Known Allergies     Latest Ref Rng & Units 10/23/2017   11:04 AM  CBC  WBC 4.0 - 10.5 K/uL 3.9   Hemoglobin 13.0 - 17.0  g/dL 14.2   Hematocrit 39.0 - 52.0 % 45.0   Platelets 150 - 400 K/uL 261       CMP     Component Value Date/Time   NA 138 10/23/2017 1104   K 4.5 10/23/2017 1104   CL 106 10/23/2017 1104   CO2 26 10/23/2017 1104   GLUCOSE 105 (H) 10/23/2017 1104   BUN 12 10/23/2017 1104   CREATININE 0.81 10/23/2017 1104   CALCIUM 9.4 10/23/2017 1104   GFRNONAA >60 10/23/2017 1104   GFRAA >60 10/23/2017 1104     No results found.     Assessment & Plan:   1. Right foot pain The patient visited with podiatry and it is noted that he does indeed have a neuroma as well as metatarsalgia.  He was given some supportive padding which has been helpful for him.  He was also given the option of a cortisone shot which she has not taken at this time.  Patient is advised that if he does continue to have issues with foot pain to follow-up with podiatry.  2. Venous (peripheral) insufficiency We had a long discussion regarding venous insufficiency as well as what this typically means.  Venous insufficiency will typically result in swelling for the patient.  Based on this the patient should continue with conservative therapy as we have discussed in his previous visits.  This includes use of medical grade compression, elevation and activity.  Will have patient follow-up with [place] on an as-needed basis unless he begins to have worsening symptoms or swelling.  Current Outpatient Medications on File Prior to Visit  Medication Sig Dispense Refill   Coenzyme Q10 (COQ10) 100 MG CAPS Take 100 mg by mouth daily.     Misc Natural Products (OSTEO BI-FLEX TRIPLE STRENGTH PO) Take 1 tablet by mouth daily.     Multiple Vitamins-Minerals (SENIOR MULTIVITAMIN PLUS PO) Take 1 tablet by mouth daily.     No current facility-administered medications on file prior to visit.    There are no Patient Instructions on file for this visit. No follow-ups on file.   Kris Hartmann, NP

## 2022-03-05 ENCOUNTER — Encounter: Payer: Self-pay | Admitting: Student

## 2022-05-02 ENCOUNTER — Encounter: Payer: PPO | Admitting: Student

## 2022-05-12 DIAGNOSIS — I10 Essential (primary) hypertension: Secondary | ICD-10-CM | POA: Diagnosis not present

## 2022-05-12 DIAGNOSIS — E119 Type 2 diabetes mellitus without complications: Secondary | ICD-10-CM | POA: Diagnosis not present

## 2022-05-12 LAB — COMPREHENSIVE METABOLIC PANEL
Albumin: 4.1 (ref 3.5–5.0)
Calcium: 9.2 (ref 8.7–10.7)
Globulin: 2.8
eGFR: 90

## 2022-05-12 LAB — BASIC METABOLIC PANEL
BUN: 18 (ref 4–21)
CO2: 26 — AB (ref 13–22)
Chloride: 102 (ref 99–108)
Creatinine: 0.9 (ref 0.6–1.3)
Glucose: 100
Potassium: 4.5 mEq/L (ref 3.5–5.1)
Sodium: 137 (ref 137–147)

## 2022-05-12 LAB — LIPID PANEL
Cholesterol: 234 — AB (ref 0–200)
HDL: 115 — AB (ref 35–70)
LDL Cholesterol: 102
Triglycerides: 81 (ref 40–160)

## 2022-05-12 LAB — CBC: RBC: 4.7 (ref 3.87–5.11)

## 2022-05-12 LAB — HEPATIC FUNCTION PANEL
ALT: 16 U/L (ref 10–40)
AST: 18 (ref 14–40)
Alkaline Phosphatase: 69 (ref 25–125)

## 2022-05-12 LAB — CBC AND DIFFERENTIAL
HCT: 46 (ref 41–53)
Hemoglobin: 15.7 (ref 13.5–17.5)
Neutrophils Absolute: 3869
Platelets: 291 10*3/uL (ref 150–400)
WBC: 6.8

## 2022-05-12 LAB — HEMOGLOBIN A1C: Hemoglobin A1C: 5.7

## 2022-05-12 LAB — TSH: TSH: 1.69 (ref 0.41–5.90)

## 2022-05-16 ENCOUNTER — Ambulatory Visit: Payer: PPO | Admitting: Student

## 2022-05-16 ENCOUNTER — Encounter: Payer: Self-pay | Admitting: Student

## 2022-05-16 VITALS — BP 138/76 | HR 91 | Temp 97.7°F | Ht 71.0 in | Wt 150.0 lb

## 2022-05-16 DIAGNOSIS — Z1159 Encounter for screening for other viral diseases: Secondary | ICD-10-CM

## 2022-05-16 DIAGNOSIS — E782 Mixed hyperlipidemia: Secondary | ICD-10-CM | POA: Diagnosis not present

## 2022-05-16 DIAGNOSIS — Z23 Encounter for immunization: Secondary | ICD-10-CM | POA: Diagnosis not present

## 2022-05-16 MED ORDER — FISH OIL 300 MG PO CAPS: 1.0000 | ORAL_CAPSULE | Freq: Every day | ORAL | 3 refills | Status: AC

## 2022-05-16 NOTE — Progress Notes (Signed)
Location:  [facility] clinic [facility].   Provider: Dr. Earnestine Mealing  Code Status: Full Code Goals of Care:     05/16/2022    1:03 PM  Advanced Directives  Does Patient Have a Medical Advance Directive? Yes  Type of Estate agent of [place];Living will  Does patient want to make changes to medical advance directive? No - Patient declined  Copy of Healthcare Power of Attorney in Chart? Yes - validated most recent copy scanned in chart (See row information)  Would patient like information on creating a medical advance directive? No - Patient declined     Chief Complaint  Patient presents with   Annual Exam    Physical.    Quality Metric Gaps    To discuss need for Hepatitis Screening, Tdap,Pneumonia, and AWV or postpone if patient refuses.     HPI: Patient is a 72 y.o. male seen today for Annual Exam. Physical.    Discussed his labs -- does not want to start medication. Would like to start fish oil and   Podiatry- he liked going to them. He has metatarsalgia. Below the toes he has a cushion that he has been using Morton's neuroma   Past Medical History:  Diagnosis Date   Anxiety    Arthritis    lower back    Past Surgical History:  Procedure Laterality Date   INGUINAL HERNIA REPAIR Right 10/28/2017   Procedure: RIGHT INGUINAL HERNIA REPAIR ERAS PATHWAY;  Surgeon: Emelia Loron, MD;  Location: [facility] OR;  Service: General;  Laterality: Right;  TAP BLOCK   INSERTION OF MESH Right 10/28/2017   Procedure: INSERTION OF MESH;  Surgeon: Emelia Loron, MD;  Location: [facility] OR;  Service: General;  Laterality: Right;   SMALL INTESTINE SURGERY     TONSILLECTOMY      No Known Allergies  Outpatient Encounter Medications as of 05/16/2022  Medication Sig   Coenzyme Q10 (COQ10) 100 MG CAPS Take 100 mg by mouth daily.   Misc Natural Products (OSTEO BI-FLEX TRIPLE STRENGTH PO) Take 1 tablet by mouth daily.   Multiple Vitamins-Minerals (SENIOR MULTIVITAMIN  PLUS PO) Take 1 tablet by mouth daily.   No facility-administered encounter medications on file as of 05/16/2022.    Review of Systems:  Review of Systems  Health Maintenance  Topic Date Due   Hepatitis C Screening  Never done   DTaP/Tdap/Td (1 - Tdap) Never done   Pneumonia Vaccine 81+ Years old (1 of 1 - PCV) Never done   Medicare Annual Wellness (AWV)  02/10/2022   COVID-19 Vaccine (5 - 2023-24 season) 06/14/2027 (Originally 09/13/2021)   INFLUENZA VACCINE  08/14/2022   Zoster Vaccines- Shingrix  Completed   HPV VACCINES  Aged Out   COLONOSCOPY (Pts 45-88yrs Insurance coverage will need to be confirmed)  Discontinued    Physical Exam: Vitals:   05/16/22 1257  BP: 138/76  Pulse: 91  Temp: 97.7 F (36.5 C)  SpO2: 96%  Weight: 150 lb (68 kg)  Height: 5\' 11"  (1.803 m)   Body mass index is 20.92 kg/m. Physical Exam Vitals reviewed.  Constitutional:      Appearance: Normal appearance. He is normal weight.  Cardiovascular:     Rate and Rhythm: Normal rate and regular rhythm.     Pulses: Normal pulses.     Heart sounds: Normal heart sounds.  Pulmonary:     Effort: Pulmonary effort is normal.     Breath sounds: Normal breath sounds.  Abdominal:  General: Abdomen is flat. Bowel sounds are normal.     Palpations: Abdomen is soft.  Skin:    General: Skin is warm and dry.  Neurological:     Mental Status: He is alert and oriented to person, place, and time.   Labs reviewed: Basic Metabolic Panel: Recent Labs    05/12/22 0000  NA 137  K 4.5  CL 102  CO2 26*  BUN 18  CREATININE 0.9  CALCIUM 9.2  TSH 1.69   Liver Function Tests: Recent Labs    05/12/22 0000  AST 18  ALT 16  ALKPHOS 69  ALBUMIN 4.1   No results for input(s): "LIPASE", "AMYLASE" in the last 8760 hours. No results for input(s): "AMMONIA" in the last 8760 hours. CBC: Recent Labs    05/12/22 0000  WBC 6.8  NEUTROABS 3,869.00  HGB 15.7  HCT 46  PLT 291   Lipid Panel: Recent Labs     05/12/22 0000  CHOL 234*  HDL 115*  LDLCALC 102  TRIG 81   Lab Results  Component Value Date   HGBA1C 5.7 05/12/2022    Procedures since last visit: No results found.  Assessment/Plan Mixed hyperlipidemia - Plan: Omega-3 Fatty Acids (FISH OIL) 300 MG CAPS, Lipid Panel  Need for vaccine for DT (diphtheria-tetanus) - Plan: Tdap vaccine greater than or equal to 7yo IM  Need for pneumococcal 20-valent conjugate vaccination - Plan: Pneumococcal conjugate vaccine 20-valent (Prevnar 20)  Need for hepatitis C screening test - Plan: Hep C Antibody  Discussed starting cholesterol medication due to increased risk for cardiovascular risk in the next 10 years (39.7-22.1% Risk of cardiovascular event (coronary or stroke death or non-fatal MI or stroke) in next 10 years.)Patient is against starting medication prescription at this time. Plan to start fish oil daily and recheck cholesterol in 6 months. Due for AWV as well. Recommend receiving vaccine at local pharmacy.    Labs/tests ordered:  * No order type specified * Next appt:  Visit date not found

## 2022-05-16 NOTE — Patient Instructions (Addendum)
You are due for your Tdap (tetanus vaccine) and your pneumonia vaccine (Prevnar 20) next year would be Prevnar 13.   Please start taking Fish Oil supplement-- we will recheck your cholesterol in 6 months (along with your hepatitis C screening).   You are due for your annual wellness visit, this will be scheduled with Abbey Chatters, NP.

## 2022-06-12 ENCOUNTER — Ambulatory Visit (INDEPENDENT_AMBULATORY_CARE_PROVIDER_SITE_OTHER): Payer: PPO | Admitting: Nurse Practitioner

## 2022-06-12 ENCOUNTER — Encounter: Payer: Self-pay | Admitting: Nurse Practitioner

## 2022-06-12 VITALS — BP 118/72 | HR 73 | Temp 97.3°F | Ht 71.0 in | Wt 148.5 lb

## 2022-06-12 DIAGNOSIS — Z0000 Encounter for general adult medical examination without abnormal findings: Secondary | ICD-10-CM

## 2022-06-12 NOTE — Patient Instructions (Signed)
  Mr. Lindenbaum , Thank you for taking time to come for your Medicare Wellness Visit. I appreciate your ongoing commitment to your health goals. Please review the following plan we discussed and let me know if I can assist you in the future.   These are the goals we discussed:  Goals   None     This is a list of the screening recommended for you and due dates:  Health Maintenance  Topic Date Due   Hepatitis C Screening  Never done   Pneumonia Vaccine (1 of 1 - PCV) Never done   COVID-19 Vaccine (5 - 2023-24 season) 06/14/2027*   Flu Shot  08/14/2022   Medicare Annual Wellness Visit  06/12/2023   Colon Cancer Screening  02/05/2025   DTaP/Tdap/Td vaccine (2 - Td or Tdap) 05/19/2032   Zoster (Shingles) Vaccine  Completed   HPV Vaccine  Aged Out  *Topic was postponed. The date shown is not the original due date.    To get PREVNAR 20 at pharmacy for your pneumonia vaccine- only needs one.

## 2022-06-12 NOTE — Progress Notes (Signed)
Subjective:   LUAY CONROW is a 72 y.o. male who presents for Medicare Annual/Subsequent preventive examination.  Review of Systems     Cardiac Risk Factors include: advanced age (>32men, >1 women)     Objective:    Today's Vitals   06/12/22 1026  BP: 118/72  Pulse: 73  Temp: (!) 97.3 F (36.3 C)  TempSrc: Temporal  SpO2: 96%  Weight: 148 lb 8 oz (67.4 kg)  Height: 5\' 11"  (1.803 m)   Body mass index is 20.71 kg/m.     06/12/2022   10:10 AM 05/16/2022    1:03 PM 11/15/2021    1:09 PM 10/28/2017    9:45 AM 10/23/2017   10:53 AM  Advanced Directives  Does Patient Have a Medical Advance Directive? Yes Yes Yes Yes Yes  Type of Estate agent of [place];Living will Healthcare Power of [place];Living will Healthcare Power of [place];Living will  Healthcare Power of [place];Living will  Does patient want to make changes to medical advance directive? No - Patient declined No - Patient declined No - Patient declined    Copy of Healthcare Power of Attorney in Chart? Yes - validated most recent copy scanned in chart (See row information) Yes - validated most recent copy scanned in chart (See row information) No - copy requested  No - copy requested  Would patient like information on creating a medical advance directive? No - Patient declined No - Patient declined No - Patient declined      Current Medications (verified) Outpatient Encounter Medications as of 06/12/2022  Medication Sig   Coenzyme Q10 (COQ10) 100 MG CAPS Take 100 mg by mouth daily.   Misc Natural Products (OSTEO BI-FLEX TRIPLE STRENGTH PO) Take 1 tablet by mouth daily.   Multiple Vitamins-Minerals (SENIOR MULTIVITAMIN PLUS PO) Take 1 tablet by mouth daily.   Omega-3 Fatty Acids (FISH OIL) 300 MG CAPS Take 1 capsule (300 mg total) by mouth daily.   No facility-administered encounter medications on file as of 06/12/2022.    Allergies (verified) Patient has no known allergies.    History: Past Medical History:  Diagnosis Date   Anxiety    Arthritis    lower back   Past Surgical History:  Procedure Laterality Date   INGUINAL HERNIA REPAIR Right 10/28/2017   Procedure: RIGHT INGUINAL HERNIA REPAIR ERAS PATHWAY;  Surgeon: Emelia Loron, MD;  Location: [facility] OR;  Service: General;  Laterality: Right;  TAP BLOCK   INSERTION OF MESH Right 10/28/2017   Procedure: INSERTION OF MESH;  Surgeon: Emelia Loron, MD;  Location: [facility] OR;  Service: General;  Laterality: Right;   SMALL INTESTINE SURGERY     TONSILLECTOMY     Family History  Problem Relation Age of Onset   Hip fracture Mother    Stroke Father    Social History   Socioeconomic History   Marital status: Married    Spouse name: Not on file   Number of children: Not on file   Years of education: Not on file   Highest education level: Not on file  Occupational History   Not on file  Tobacco Use   Smoking status: Former    Types: Cigarettes    Quit date: 10/24/2015    Years since quitting: 6.6   Smokeless tobacco: Never  Vaping Use   Vaping Use: Former   Start date: 10/14/2015   Quit date: 01/13/2017   Devices: used about 2 years  Substance and Sexual Activity   Alcohol use:  Yes    Comment: nightly   Drug use: Never   Sexual activity: Not on file  Other Topics Concern   Not on file  Social History Narrative   Not on file   Social Determinants of Health   Financial Resource Strain: Not on file  Food Insecurity: Not on file  Transportation Needs: Not on file  Physical Activity: Not on file  Stress: Not on file  Social Connections: Not on file    Tobacco Counseling Counseling given: Not Answered   Clinical Intake:  Pre-visit preparation completed: Yes  Pain : No/denies pain     BMI - recorded: 20 Nutritional Status: BMI of 19-24  Normal Nutritional Risks: None Diabetes: No  How often do you need to have someone help you when you read instructions, pamphlets, or other  written materials from your doctor or pharmacy?: 1 - Never  Diabetic?no         Activities of Daily Living    06/12/2022   10:39 AM  In your present state of health, do you have any difficulty performing the following activities:  Hearing? 0  Vision? 0  Difficulty concentrating or making decisions? 0  Walking or climbing stairs? 0  Dressing or bathing? 0  Doing errands, shopping? 0  Preparing Food and eating ? N  Using the Toilet? N  In the past six months, have you accidently leaked urine? Y  Do you have problems with loss of bowel control? N  Managing your Medications? N  Managing your Finances? N  Housekeeping or managing your Housekeeping? N    Patient Care Team: Earnestine Mealing, MD as PCP - General (Family Medicine)  Indicate any recent Medical Services you may have received from other than [facility] providers in the past year (date may be approximate).     Assessment:   This is a routine wellness examination for Laith.  Hearing/Vision screen Hearing Screening - Comments:: No hearing issues Vision Screening - Comments:: Last eye exam, less than 12 months ago with [facility] in [place], plans to switch to the [facility] group here in [place].   Dietary issues and exercise activities discussed: Current Exercise Habits: Structured exercise class, Type of exercise: strength training/weights;yoga;walking, Time (Minutes): 30, Frequency (Times/Week): 7, Weekly Exercise (Minutes/Week): 210   Goals Addressed   None    Depression Screen    06/12/2022   10:14 AM  PHQ 2/9 Scores  PHQ - 2 Score 0    Fall Risk    06/12/2022   10:14 AM  Fall Risk   Falls in the past year? 0  Number falls in past yr: 0  Injury with Fall? 0  Risk for fall due to : No Fall Risks  Follow up Falls evaluation completed    FALL RISK PREVENTION PERTAINING TO THE HOME:  Any stairs in or around the home? Yes  If so, are there any without handrails? No  Home free of loose  throw rugs in walkways, pet beds, electrical cords, etc? Yes  Adequate lighting in your home to reduce risk of falls? Yes   ASSISTIVE DEVICES UTILIZED TO PREVENT FALLS:  Life alert? No  Use of a cane, walker or w/c? No  Grab bars in the bathroom? Yes  Shower chair or bench in shower? No  Elevated toilet seat or a handicapped toilet? No   TIMED UP AND GO:  Was the test performed? No .    Cognitive Function:    06/12/2022   10:30  AM  MMSE - Mini Mental State Exam  Orientation to time 5  Orientation to Place 5  Registration 3  Attention/ Calculation 5  Recall 3  Language- name 2 objects 2  Language- repeat 1  Language- follow 3 step command 3  Language- read & follow direction 0  Language-read & follow direction-comments Did not read out loud  Write a sentence 1  Copy design 1  Total score 29        Immunizations Immunization History  Administered Date(s) Administered   Fluad Quad(high Dose 65+) 09/30/2021   Influenza, High Dose Seasonal PF 10/13/2021   PFIZER(Purple Top)SARS-COV-2 Vaccination 02/24/2019, 03/21/2019   Pfizer Covid-19 Vaccine Bivalent Booster 9yrs & up 10/14/2019, 06/05/2020   Tdap 05/20/2022   Zoster Recombinat (Shingrix) 08/27/2021, 12/13/2021    TDAP status: Up to date  Flu Vaccine status: Up to date  Pneumococcal vaccine status: Due, Education has been provided regarding the importance of this vaccine. Advised may receive this vaccine at local pharmacy or Health Dept. Aware to provide a copy of the vaccination record if obtained from local pharmacy or Health Dept. Verbalized acceptance and understanding.  Covid-19 vaccine status: Information provided on how to obtain vaccines.   Qualifies for Shingles Vaccine? Yes   Zostavax completed No   Shingrix Completed?: Yes  Screening Tests Health Maintenance  Topic Date Due   Hepatitis C Screening  Never done   Pneumonia Vaccine 5+ Years old (1 of 1 - PCV) Never done   COVID-19 Vaccine (5 -  2023-24 season) 06/14/2027 (Originally 09/13/2021)   INFLUENZA VACCINE  08/14/2022   Medicare Annual Wellness (AWV)  06/12/2023   Colonoscopy  02/05/2025   DTaP/Tdap/Td (2 - Td or Tdap) 05/19/2032   Zoster Vaccines- Shingrix  Completed   HPV VACCINES  Aged Out    Health Maintenance  Health Maintenance Due  Topic Date Due   Hepatitis C Screening  Never done   Pneumonia Vaccine 65+ Years old (1 of 1 - PCV) Never done    Colorectal cancer screening: Type of screening: Colonoscopy. Completed 02/06/2015. Repeat every 10 years  Lung Cancer Screening: (Low Dose CT Chest recommended if Age 31-80 years, 30 pack-year currently smoking OR have quit w/in 15years.) does not qualify.   Lung Cancer Screening Referral: na  Additional Screening:  Hepatitis C Screening: does qualify; Complete with next blood work  Vision Screening: Recommended annual ophthalmology exams for early detection of glaucoma and other disorders of the eye. Is the patient up to date with their annual eye exam?  Yes  Who is the provider or what is the name of the office in which the patient attends annual eye exams? [facility] If pt is not established with a provider, would they like to be referred to a provider to establish care? No .   Dental Screening: Recommended annual dental exams for proper oral hygiene  Community Resource Referral / Chronic Care Management: CRR required this visit?  No   CCM required this visit?  No      Plan:     I have personally reviewed and noted the following in the patient's chart:   Medical and social history Use of alcohol, tobacco or illicit drugs  Current medications and supplements including opioid prescriptions. Patient is not currently taking opioid prescriptions. Functional ability and status Nutritional status Physical activity Advanced directives List of other physicians Hospitalizations, surgeries, and ER visits in previous 12 months Vitals Screenings to include  cognitive, depression, and falls Referrals  and appointments  In addition, I have reviewed and discussed with patient certain preventive protocols, quality metrics, and best practice recommendations. A written personalized care plan for preventive services as well as general preventive health recommendations were provided to patient.     Sharon Seller, NP   06/12/2022   Place of service: [facility]

## 2022-08-08 ENCOUNTER — Telehealth: Payer: Self-pay | Admitting: Student

## 2022-08-08 ENCOUNTER — Ambulatory Visit: Payer: PPO | Admitting: Student

## 2022-08-08 VITALS — BP 138/88 | HR 92 | Temp 98.0°F | Ht 71.0 in | Wt 149.0 lb

## 2022-08-08 DIAGNOSIS — R300 Dysuria: Secondary | ICD-10-CM | POA: Diagnosis not present

## 2022-08-08 MED ORDER — SULFAMETHOXAZOLE-TRIMETHOPRIM 800-160 MG PO TABS
1.0000 | ORAL_TABLET | Freq: Two times a day (BID) | ORAL | 0 refills | Status: AC
Start: 2022-08-08 — End: 2022-08-15

## 2022-08-08 MED ORDER — SULFAMETHOXAZOLE-TRIMETHOPRIM 800-160 MG PO TABS
1.0000 | ORAL_TABLET | Freq: Two times a day (BID) | ORAL | 0 refills | Status: DC
Start: 2022-08-08 — End: 2022-08-08

## 2022-08-08 MED ORDER — SACCHAROMYCES BOULARDII 250 MG PO CAPS
250.0000 mg | ORAL_CAPSULE | Freq: Two times a day (BID) | ORAL | 0 refills | Status: AC
Start: 2022-08-08 — End: ?

## 2022-08-08 NOTE — Patient Instructions (Addendum)
Please take Bactrim in the morning and evening. If you get yogurt, you don't have to take the probiotic otherwise you can take it with the bactrim to help with stomach upset.   Please drink 6-8 glasses of water per day. If you need to add something to the water, consider crystal light. Tea does not count as it is a diuretic which can contribute to dehydration.

## 2022-08-08 NOTE — Progress Notes (Unsigned)
[facility].   Provider: Dr. Earnestine Mealing  Code Status: Full Code Goals of Care:     08/08/2022    3:12 PM  Advanced Directives  Does Patient Have a Medical Advance Directive? Yes  Type of Estate agent of [place];Living will  Does patient want to make changes to medical advance directive? No - Patient declined  Copy of Healthcare Power of Attorney in Chart? Yes - validated most recent copy scanned in chart (See row information)     Chief Complaint  Patient presents with   Acute Visit    Complains of UTI. Having Urinary Frequency, burning and Night Sweats since Monday.     HPI: Patient is a 72 y.o. male seen today for an acute visit for Possible UTI. Frequency, Burning and Night Sweats since Monday.   He has had night sweats, urgency. Burning with urination. Frequency. His wife has noticed how much he has had issues during the week.   Past Medical History:  Diagnosis Date   Anxiety    Arthritis    lower back    Past Surgical History:  Procedure Laterality Date   INGUINAL HERNIA REPAIR Right 10/28/2017   Procedure: RIGHT INGUINAL HERNIA REPAIR ERAS PATHWAY;  Surgeon: Emelia Loron, MD;  Location: [facility] OR;  Service: General;  Laterality: Right;  TAP BLOCK   INSERTION OF MESH Right 10/28/2017   Procedure: INSERTION OF MESH;  Surgeon: Emelia Loron, MD;  Location: [facility] OR;  Service: General;  Laterality: Right;   SMALL INTESTINE SURGERY     TONSILLECTOMY      No Known Allergies  Outpatient Encounter Medications as of 08/08/2022  Medication Sig   Coenzyme Q10 (COQ10) 100 MG CAPS Take 100 mg by mouth daily.   Misc Natural Products (OSTEO BI-FLEX TRIPLE STRENGTH PO) Take 1 tablet by mouth daily.   Multiple Vitamins-Minerals (SENIOR MULTIVITAMIN PLUS PO) Take 1 tablet by mouth daily.   Omega-3 Fatty Acids (FISH OIL) 300 MG CAPS Take 1 capsule (300 mg total) by mouth daily.   No facility-administered encounter medications on file  as of 08/08/2022.    Review of Systems:  Review of Systems  Health Maintenance  Topic Date Due   Hepatitis C Screening  Never done   Pneumonia Vaccine 30+ Years old (1 of 1 - PCV) Never done   COVID-19 Vaccine (5 - 2023-24 season) 06/14/2027 (Originally 09/13/2021)   INFLUENZA VACCINE  08/14/2022   Medicare Annual Wellness (AWV)  06/12/2023   Colonoscopy  02/05/2025   DTaP/Tdap/Td (2 - Td or Tdap) 05/19/2032   Zoster Vaccines- Shingrix  Completed   HPV VACCINES  Aged Out    Physical Exam: Vitals:   08/08/22 1509  BP: 138/88  Pulse: 92  Temp: 98 F (36.7 C)  SpO2: 96%  Weight: 149 lb (67.6 kg)  Height: 5\' 11"  (1.803 m)   Body mass index is 20.78 kg/m. Physical Exam Constitutional:      Appearance: Normal appearance.  Abdominal:     Tenderness: There is no abdominal tenderness.  Neurological:     Mental Status: He is alert.     Labs reviewed: Basic Metabolic Panel: Recent Labs    05/12/22 0000  NA 137  K 4.5  CL 102  CO2 26*  BUN 18  CREATININE 0.9  CALCIUM 9.2  TSH 1.69   Liver Function Tests: Recent Labs    05/12/22 0000  AST 18  ALT 16  ALKPHOS 69  ALBUMIN 4.1  No results for input(s): "LIPASE", "AMYLASE" in the last 8760 hours. No results for input(s): "AMMONIA" in the last 8760 hours. CBC: Recent Labs    05/12/22 0000  WBC 6.8  NEUTROABS 3,869.00  HGB 15.7  HCT 46  PLT 291   Lipid Panel: Recent Labs    05/12/22 0000  CHOL 234*  HDL 115*  LDLCALC 102  TRIG 81   Lab Results  Component Value Date   HGBA1C 5.7 05/12/2022    Procedures since last visit: No results found.  Assessment/Plan Dysuria - Plan: sulfamethoxazole-trimethoprim (BACTRIM DS) 800-160 MG tablet, saccharomyces boulardii (FLORASTOR) 250 MG capsule, Culture, Urine, Urinalysis with Reflex Microscopic, DISCONTINUED: sulfamethoxazole-trimethoprim (BACTRIM DS) 800-160 MG tablet Persistent symptoms despite attempts at hydration changes. Only drinking 1-2 bottles of  water per day. Encourage hydration. F/u pending labs.   Labs/tests ordered:  * No order type specified * Next appt:  Visit date not found

## 2022-08-10 ENCOUNTER — Encounter: Payer: Self-pay | Admitting: Student

## 2022-09-30 NOTE — Telephone Encounter (Signed)
Gave to CI

## 2022-11-17 DIAGNOSIS — E782 Mixed hyperlipidemia: Secondary | ICD-10-CM | POA: Diagnosis not present

## 2022-11-17 DIAGNOSIS — Z1159 Encounter for screening for other viral diseases: Secondary | ICD-10-CM | POA: Diagnosis not present

## 2022-11-17 LAB — LIPID PANEL
Cholesterol: 222 mg/dL — ABNORMAL HIGH (ref <200)
HDL: 110 mg/dL (ref >=40)
LDL Cholesterol (Calc): 97 mg/dL
Non-HDL Cholesterol (Calc): 112 mg/dL (ref <130)
Total CHOL/HDL Ratio: 2 (calc) (ref <5.0)
Triglycerides: 68 mg/dL (ref <150)

## 2022-11-17 LAB — HEPATITIS C ANTIBODY: Hepatitis C Ab: NONREACTIVE

## 2023-05-18 ENCOUNTER — Ambulatory Visit: Payer: PPO | Admitting: Student

## 2023-05-29 ENCOUNTER — Ambulatory Visit: Admitting: Student

## 2023-05-29 ENCOUNTER — Encounter: Payer: Self-pay | Admitting: Student

## 2023-05-29 VITALS — BP 122/62 | HR 62 | Temp 97.6°F | Ht 71.0 in | Wt 140.0 lb

## 2023-05-29 DIAGNOSIS — E0849 Diabetes mellitus due to underlying condition with other diabetic neurological complication: Secondary | ICD-10-CM

## 2023-05-29 DIAGNOSIS — E782 Mixed hyperlipidemia: Secondary | ICD-10-CM | POA: Diagnosis not present

## 2023-05-29 DIAGNOSIS — R634 Abnormal weight loss: Secondary | ICD-10-CM

## 2023-05-29 NOTE — Patient Instructions (Signed)
 VISIT SUMMARY:  During your visit, we discussed your unexpected weight loss of approximately nine pounds over the past year. We reviewed your diet, exercise habits, and smoking history to understand potential causes. You have no other current health concerns or symptoms.  YOUR PLAN:  -UNINTENTIONAL WEIGHT LOSS: Unintentional weight loss means losing weight without trying, which can be due to various reasons such as insufficient protein intake or increased physical activity. To address this, you should aim for 30 grams of protein per meal to maintain muscle mass. Consider protein supplements like Premier protein, Fairlife protein, or Ensure, especially if you skip breakfast. We will also check your thyroid  function and electrolytes to rule out any metabolic issues. Please follow up in 1-2 months to monitor your weight stability.  -TOBACCO USE: Your history of smoking for about 40 years, even though you quit 20 years ago, still requires attention due to your recent weight loss. We discussed the possibility of a low-dose CT scan to screen for lung cancer if your weight loss continues. This scan is a quick imaging method to check for lung nodules or masses.  INSTRUCTIONS:  Please follow up in 1-2 months to monitor your weight stability. We will also review the results of your thyroid  function and electrolyte tests at that time.

## 2023-05-30 NOTE — Progress Notes (Signed)
 [facility]: [facility] Provider: Jann Melody  Code Status: Full Code Goals of Care:     05/29/2023   10:23 AM  Advanced Directives  Does Patient Have a Medical Advance Directive? Yes  Type of Estate agent of [place];Living will  Does patient want to make changes to medical advance directive? No - Patient declined  Copy of Healthcare Power of Attorney in Chart? Yes - validated most recent copy scanned in chart (See row information)     Chief Complaint  Patient presents with   Medical Management of Chronic Issues    Medical Management of Chronic Issues. Yearly Follow up. To discuss need for Covid    HPI: Patient is a 73 y.o. male seen today for medical management of chronic diseases.   Discussed the use of AI scribe software for clinical note transcription with the patient, who gave verbal consent to proceed.  History of Present Illness   David Gillespie is a 73 year old male who presents with unexpected weight loss.  He has experienced an unexpected weight loss of approximately nine pounds over the past year, decreasing from 149 pounds in July of last year to 140 pounds currently. He is unsure of the cause and was unaware of the weight loss until today. He participates in a TRX class and does not eat breakfast, which may contribute to the weight loss. His typical diet includes a [place] sandwich for lunch and chicken or fish for dinner, with occasional roasts, as his partner is vegetarian.  He has a history of smoking, having smoked close to one pack per day for approximately 40 years, starting in college and quitting around the early 2000s. It has been about 20 years since he quit smoking. He has not had a CT scan for lung cancer screening and is unsure if it was ever recommended by a previous doctor.  No current concerns or symptoms aside from the noted weight loss. He has not experienced any significant changes in his health.          Past Medical History:  Diagnosis Date   Anxiety    Arthritis    lower back    Past Surgical History:  Procedure Laterality Date   INGUINAL HERNIA REPAIR Right 10/28/2017   Procedure: RIGHT INGUINAL HERNIA REPAIR ERAS PATHWAY;  Surgeon: Enid Harry, MD;  Location: [facility] OR;  Service: General;  Laterality: Right;  TAP BLOCK   INSERTION OF MESH Right 10/28/2017   Procedure: INSERTION OF MESH;  Surgeon: Enid Harry, MD;  [facility];  Service: General;  Laterality: Right;   SMALL INTESTINE SURGERY     TONSILLECTOMY      No Known Allergies  Outpatient Encounter Medications as of 05/29/2023  Medication Sig   Coenzyme Q10 (COQ10) 100 MG CAPS Take 100 mg by mouth daily.   Misc Natural Products (OSTEO BI-FLEX TRIPLE STRENGTH PO) Take 1 tablet by mouth daily.   Multiple Vitamins-Minerals (SENIOR MULTIVITAMIN PLUS PO) Take 1 tablet by mouth daily.   Omega-3 Fatty Acids (FISH OIL ) 300 MG CAPS Take 1 capsule (300 mg total) by mouth daily.   saccharomyces boulardii (FLORASTOR) 250 MG capsule Take 1 capsule (250 mg total) by mouth 2 (two) times daily. (Patient not taking: Reported on 05/29/2023)   No facility-administered encounter medications on file as of 05/29/2023.    Review of Systems:  Review of Systems  Health Maintenance  Topic Date Due   FOOT EXAM  Never  done   OPHTHALMOLOGY EXAM  Never done   Diabetic kidney evaluation - Urine ACR  Never done   COVID-19 Vaccine (5 - 2024-25 season) 09/14/2022   HEMOGLOBIN A1C  11/11/2022   Diabetic kidney evaluation - eGFR measurement  05/12/2023   Medicare Annual Wellness (AWV)  06/12/2023   INFLUENZA VACCINE  08/14/2023   Colonoscopy  02/05/2025   DTaP/Tdap/Td (2 - Td or Tdap) 05/19/2032   Pneumonia Vaccine 76+ Years old  Completed   Hepatitis C Screening  Completed   Zoster Vaccines- Shingrix  Completed   HPV VACCINES  Aged Out   Meningococcal B Vaccine  Aged Out    Physical Exam: Vitals:   05/29/23 1020  BP:  122/62  Pulse: 62  Temp: 97.6 F (36.4 C)  SpO2: 96%  Weight: 140 lb (63.5 kg)  Height: 5\' 11"  (1.803 m)   Body mass index is 19.53 kg/m. Physical Exam Physical Exam   MEASUREMENTS: Weight- 140, BMI- 19.5. CHEST: Lungs clear to auscultation bilaterally.      Labs reviewed: Basic Metabolic Panel: No results for input(s): "NA", "K", "CL", "CO2", "GLUCOSE", "BUN", "CREATININE", "CALCIUM", "MG", "PHOS", "TSH" in the last 8760 hours. Liver Function Tests: No results for input(s): "AST", "ALT", "ALKPHOS", "BILITOT", "PROT", "ALBUMIN" in the last 8760 hours. No results for input(s): "LIPASE", "AMYLASE" in the last 8760 hours. No results for input(s): "AMMONIA" in the last 8760 hours. CBC: No results for input(s): "WBC", "NEUTROABS", "HGB", "HCT", "MCV", "PLT" in the last 8760 hours. Lipid Panel: Recent Labs    11/17/22 0735  CHOL 222*  HDL 110  LDLCALC 97  TRIG 68  CHOLHDL 2.0   Lab Results  Component Value Date   HGBA1C 5.7 05/12/2022   Assessment  Weight loss - Plan: Complete Metabolic Panel with eGFR, CBC With Differential/Platelet, Microalbumin/Creatinine Ratio, Urine, Hemoglobin A1c, TSH, Vitamin B12, VITAMIN D 25 Hydroxy (Vit-D Deficiency, Fractures), Lipid Panel  Mixed hyperlipidemia - Plan: Lipid Panel  Diabetes due to undrl condition w oth diabetic neuro comp (HCC), Chronic - Plan: Complete Metabolic Panel with eGFR, CBC With Differential/Platelet, Microalbumin/Creatinine Ratio, Urine, Hemoglobin A1c, TSH, Vitamin B12, VITAMIN D 25 Hydroxy (Vit-D Deficiency, Fractures), Lipid Panel   Unintentional weight loss Weight decreased from 149 lbs in July last year to 140 lbs currently, with BMI at 19.5, below the desired range of 21. Potential causes include insufficient protein intake due to skipping breakfast and exercise. Differential diagnosis includes increased physical activity or metabolic issues such as hyperthyroidism. Insufficient protein intake can lead to poor  infection response and muscle wasting. - Educate on protein content in foods. - Recommend 30 grams of protein per meal to maintain muscle mass. - Suggest protein supplements like Premier protein, Fairlife protein, or Ensure, especially if breakfast is skipped. - Order thyroid  function and electrolyte tests. - Schedule follow-up in 1-2 months to monitor weight stability.  Tobacco use Smoked for approximately 40 years, quitting in the early 2000s. Although outside the 15-year range for lung cancer screening, recent weight changes warrant consideration of lung health. A low-dose CT scan is a quick imaging method to check for lung nodules or masses. - Discuss low-dose CT scan for lung cancer screening if weight loss continues.      Labs/tests ordered: .dia Next appt:  06/25/2023

## 2023-06-04 DIAGNOSIS — R634 Abnormal weight loss: Secondary | ICD-10-CM | POA: Diagnosis not present

## 2023-06-04 DIAGNOSIS — E0849 Diabetes mellitus due to underlying condition with other diabetic neurological complication: Secondary | ICD-10-CM | POA: Diagnosis not present

## 2023-06-04 DIAGNOSIS — E782 Mixed hyperlipidemia: Secondary | ICD-10-CM | POA: Diagnosis not present

## 2023-06-04 LAB — VITAMIN D 25 HYDROXY (VIT D DEFICIENCY, FRACTURES): Vit D, 25-Hydroxy: 66 ng/mL (ref 30–100)

## 2023-06-04 LAB — CBC WITH DIFFERENTIAL/PLATELET
Absolute Lymphocytes: 1718 {cells}/uL (ref 850–3900)
Absolute Monocytes: 470 {cells}/uL (ref 200–950)
Basophils Absolute: 50 {cells}/uL (ref 0–200)
Basophils Relative: 1.2 %
Eosinophils Absolute: 340 {cells}/uL (ref 15–500)
Eosinophils Relative: 8.1 %
HCT: 45.3 % (ref 38.5–50.0)
Hemoglobin: 15.2 g/dL (ref 13.2–17.1)
MCH: 33.3 pg — ABNORMAL HIGH (ref 27.0–33.0)
MCHC: 33.6 g/dL (ref 32.0–36.0)
MCV: 99.3 fL (ref 80.0–100.0)
MPV: 9.2 fL (ref 7.5–12.5)
Monocytes Relative: 11.2 %
Neutro Abs: 1621 {cells}/uL (ref 1500–7800)
Neutrophils Relative %: 38.6 %
Platelets: 277 10*3/uL (ref 140–400)
RBC: 4.56 10*6/uL (ref 4.20–5.80)
RDW: 12.4 % (ref 11.0–15.0)
Total Lymphocyte: 40.9 %
WBC: 4.2 10*3/uL (ref 3.8–10.8)

## 2023-06-04 LAB — LIPID PANEL
Cholesterol: 209 mg/dL — ABNORMAL HIGH (ref <200)
HDL: 110 mg/dL (ref >=40)
LDL Cholesterol (Calc): 85 mg/dL
Non-HDL Cholesterol (Calc): 99 mg/dL (ref <130)
Total CHOL/HDL Ratio: 1.9 (calc) (ref <5.0)
Triglycerides: 64 mg/dL (ref <150)

## 2023-06-04 LAB — COMPLETE METABOLIC PANEL WITHOUT GFR
AG Ratio: 1.6 (calc) (ref 1.0–2.5)
ALT: 19 U/L (ref 9–46)
AST: 22 U/L (ref 10–35)
Albumin: 4.3 g/dL (ref 3.6–5.1)
Alkaline phosphatase (APISO): 63 U/L (ref 35–144)
BUN: 22 mg/dL (ref 7–25)
CO2: 28 mmol/L (ref 20–32)
Calcium: 9.6 mg/dL (ref 8.6–10.3)
Chloride: 102 mmol/L (ref 98–110)
Creat: 0.73 mg/dL (ref 0.70–1.28)
Globulin: 2.7 g/dL (ref 1.9–3.7)
Glucose, Bld: 81 mg/dL (ref 65–99)
Potassium: 4.2 mmol/L (ref 3.5–5.3)
Sodium: 139 mmol/L (ref 135–146)
Total Bilirubin: 0.7 mg/dL (ref 0.2–1.2)
Total Protein: 7 g/dL (ref 6.1–8.1)

## 2023-06-04 LAB — MICROALBUMIN / CREATININE URINE RATIO
Creatinine, Urine: 76 mg/dL (ref 20–320)
Microalb Creat Ratio: 4 mg/g{creat} (ref <30)
Microalb, Ur: 0.3 mg/dL

## 2023-06-04 LAB — TSH: TSH: 1.07 m[IU]/L (ref 0.40–4.50)

## 2023-06-04 LAB — VITAMIN B12: Vitamin B-12: 436 pg/mL (ref 200–1100)

## 2023-06-04 LAB — HEMOGLOBIN A1C
Hgb A1c MFr Bld: 5.6 % (ref <5.7)
Mean Plasma Glucose: 114 mg/dL
eAG (mmol/L): 6.3 mmol/L

## 2023-06-11 ENCOUNTER — Ambulatory Visit: Payer: Self-pay | Admitting: Student

## 2023-06-18 ENCOUNTER — Encounter: Payer: PPO | Admitting: Nurse Practitioner

## 2023-06-25 ENCOUNTER — Ambulatory Visit: Admitting: Nurse Practitioner

## 2023-06-25 ENCOUNTER — Encounter: Payer: Self-pay | Admitting: Nurse Practitioner

## 2023-06-25 VITALS — BP 134/78 | HR 84 | Temp 97.8°F | Resp 20 | Ht 71.0 in | Wt 142.0 lb

## 2023-06-25 DIAGNOSIS — Z0000 Encounter for general adult medical examination without abnormal findings: Secondary | ICD-10-CM

## 2023-06-25 NOTE — Progress Notes (Signed)
 Subjective:   David Gillespie is a 73 y.o. male who presents for Medicare Annual/Subsequent preventive examination.  Visit Complete: [place] person [facility]   Cardiac Risk Factors include: advanced age (>55men, >46 women)     Objective:    Today's Vitals   06/25/23 1025  BP: 134/78  Pulse: 84  Resp: 20  Temp: 97.8 F (36.6 C)  SpO2: 97%  Weight: 142 lb (64.4 kg)  Height: 5' 11 (1.803 m)   Body mass index is 19.8 kg/m.     06/25/2023   10:17 AM 05/29/2023   10:23 AM 08/08/2022    3:12 PM 06/12/2022   10:10 AM 05/16/2022    1:03 PM 11/15/2021    1:09 PM 10/28/2017    9:45 AM  Advanced Directives  Does Patient Have a Medical Advance Directive? Yes Yes Yes Yes Yes Yes Yes   Type of Estate agent of [place];Living will Healthcare Power of [place];Living will Healthcare Power of [place];Living will Healthcare Power of [place];Living will Healthcare Power of [place];Living will Healthcare Power of [place];Living will   Does patient want to make changes to medical advance directive? No - Patient declined No - Patient declined No - Patient declined No - Patient declined No - Patient declined No - Patient declined   Copy of Healthcare Power of Attorney in Chart? Yes - validated most recent copy scanned in chart (See row information) Yes - validated most recent copy scanned in chart (See row information) Yes - validated most recent copy scanned in chart (See row information) Yes - validated most recent copy scanned in chart (See row information) Yes - validated most recent copy scanned in chart (See row information) No - copy requested   Would patient like information on creating a medical advance directive?    No - Patient declined No - Patient declined No - Patient declined      Data saved with a previous flowsheet row definition    Current Medications (verified) Outpatient Encounter Medications as of 06/25/2023  Medication Sig   Coenzyme Q10 (COQ10) 100  MG CAPS Take 100 mg by mouth daily.   Misc Natural Products (OSTEO BI-FLEX TRIPLE STRENGTH PO) Take 1 tablet by mouth daily.   Multiple Vitamins-Minerals (SENIOR MULTIVITAMIN PLUS PO) Take 1 tablet by mouth daily.   Omega-3 Fatty Acids (FISH OIL ) 300 MG CAPS Take 1 capsule (300 mg total) by mouth daily.   saccharomyces boulardii (FLORASTOR) 250 MG capsule Take 1 capsule (250 mg total) by mouth 2 (two) times daily. (Patient not taking: Reported on 05/29/2023)   No facility-administered encounter medications on file as of 06/25/2023.    Allergies (verified) Patient has no known allergies.   History: Past Medical History:  Diagnosis Date   Anxiety    Arthritis    lower back   Past Surgical History:  Procedure Laterality Date   INGUINAL HERNIA REPAIR Right 10/28/2017   Procedure: RIGHT INGUINAL HERNIA REPAIR ERAS PATHWAY;  Surgeon: Enid Harry, MD;  Location: [facility] OR;  Service: General;  Laterality: Right;  TAP BLOCK   INSERTION OF MESH Right 10/28/2017   Procedure: INSERTION OF MESH;  Surgeon: Enid Harry, MD;  Location: [facility] OR;  Service: General;  Laterality: Right;   SMALL INTESTINE SURGERY     TONSILLECTOMY     Family History  Problem Relation Age of Onset   Hip fracture Mother    Stroke Father    Social History   Socioeconomic History   Marital status: Married  Spouse name: Not on file   Number of children: Not on file   Years of education: Not on file   Highest education level: Bachelor's degree (e.g., BA, AB, BS)  Occupational History   Not on file  Tobacco Use   Smoking status: Former    Current packs/day: 0.00    Types: Cigarettes    Quit date: 10/24/2015    Years since quitting: 7.6   Smokeless tobacco: Never  Vaping Use   Vaping status: Former   Start date: 10/14/2015   Quit date: 01/13/2017   Devices: used about 2 years  Substance and Sexual Activity   Alcohol use: Yes    Comment: nightly   Drug use: Never   Sexual activity: Not on file   Other Topics Concern   Not on file  Social History Narrative   Not on file   Social Drivers of Health   Financial Resource Strain: Low Risk  (05/14/2023)   Overall Financial Resource Strain (CARDIA)    Difficulty of Paying Living Expenses: Not hard at all  Food Insecurity: No Food Insecurity (05/14/2023)   Hunger Vital Sign    Worried About Running Out of Food in the Last Year: Never true    Ran Out of Food in the Last Year: Never true  Transportation Needs: No Transportation Needs (05/14/2023)   PRAPARE - Administrator, Civil Service (Medical): No    Lack of Transportation (Non-Medical): No  Physical Activity: Sufficiently Active (05/14/2023)   Exercise Vital Sign    Days of Exercise per Week: 7 days    Minutes of Exercise per Session: 40 min  Stress: No Stress Concern Present (05/14/2023)   [employer] of Occupational Health - Occupational Stress Questionnaire    Feeling of Stress : Only a little  Social Connections: Moderately Integrated (05/14/2023)   Social Connection and Isolation Panel    Frequency of Communication with Friends and Family: More than three times a week    Frequency of Social Gatherings with Friends and Family: Three times a week    Attends Religious Services: More than 4 times per year    Active Member of Clubs or Organizations: No    Attends Engineer, structural: Not on file    Marital Status: Married    Tobacco Counseling Counseling given: Not Answered   Clinical Intake:  Pre-visit preparation completed: Yes  Pain : No/denies pain     BMI - recorded: 19.98 Nutritional Status: BMI of 19-24  Normal  How often do you need to have someone help you when you read instructions, pamphlets, or other written materials from your doctor or pharmacy?: 1 - Never         Activities of Daily Living    06/25/2023   10:21 AM 06/21/2023    8:44 AM  In your present state of health, do you have any difficulty performing the following  activities:  Hearing? 0 0  Vision? 0 0  Difficulty concentrating or making decisions? 0 0  Walking or climbing stairs? 0 0  Dressing or bathing? 0 0  Doing errands, shopping? 0 0  Preparing Food and eating ? N N  Using the Toilet? N N  In the past six months, have you accidently leaked urine? N N  Do you have problems with loss of bowel control? N N  Managing your Medications? N N  Managing your Finances? N N  Housekeeping or managing your Housekeeping? N N    Patient Care  Team: Valrie Gehrig, MD as PCP - General (Family Medicine)  Indicate any recent Medical Services you may have received from other than [facility] providers in the past year (date may be approximate).     Assessment:   This is a routine wellness examination for [place].  Hearing/Vision screen Hearing Screening - Comments:: No hearing issues Vision Screening - Comments:: No issues with vision last exam 2 years ago (My eye doctor in [place])   Goals Addressed   None    Depression Screen    06/25/2023   10:19 AM 05/29/2023   10:22 AM 06/12/2022   10:14 AM  PHQ 2/9 Scores  PHQ - 2 Score 0 0 0    Fall Risk    06/25/2023   10:19 AM 06/21/2023    8:44 AM 05/29/2023   10:22 AM 06/12/2022   10:14 AM  Fall Risk   Falls in the past year? 0 0 0 0  Number falls in past yr: 0  0 0  Injury with Fall? 0  0 0  Risk for fall due to : No Fall Risks  No Fall Risks No Fall Risks  Follow up Falls evaluation completed  Falls evaluation completed Falls evaluation completed    MEDICARE RISK AT HOME: Medicare Risk at Home Any stairs in or around the home?: Yes If so, are there any without handrails?: No Home free of loose throw rugs in walkways, pet beds, electrical cords, etc?: Yes Adequate lighting in your home to reduce risk of falls?: Yes Life alert?: No Use of a cane, walker or w/c?: No Grab bars in the bathroom?: Yes Shower chair or bench in shower?: No Elevated toilet seat or a handicapped toilet?:  No  TIMED UP AND GO:  Was the test performed?  No    Cognitive Function:    06/12/2022   10:30 AM  MMSE - Mini Mental State Exam  Orientation to time 5  Orientation to Place 5  Registration 3  Attention/ Calculation 5  Recall 3  Language- name 2 objects 2  Language- repeat 1  Language- follow 3 step command 3  Language- read & follow direction 0  Language-read & follow direction-comments Did not read out loud  Write a sentence 1  Copy design 1  Total score 29        06/25/2023   10:19 AM  6CIT Screen  What Year? 0 points  What month? 0 points  What time? 0 points  Count back from 20 0 points  Months in reverse 0 points  Repeat phrase 0 points  Total Score 0 points    Immunizations Immunization History  Administered Date(s) Administered   Fluad Quad(high Dose 65+) 09/30/2021   Influenza, High Dose Seasonal PF 10/13/2021   Influenza-Unspecified 10/07/2022   PFIZER(Purple Top)SARS-COV-2 Vaccination 02/24/2019, 03/21/2019   PNEUMOCOCCAL CONJUGATE-20 07/10/2022   Pfizer Covid-19 Vaccine Bivalent Booster 44yrs & up 10/14/2019, 06/05/2020   Tdap 05/20/2022   Zoster Recombinant(Shingrix) 08/27/2021, 12/13/2021    TDAP status: Up to date  Flu Vaccine status: Up to date  Pneumococcal vaccine status: Up to date  Covid-19 vaccine status: Information provided on how to obtain vaccines.   Qualifies for Shingles Vaccine? Yes   Zostavax completed No   Shingrix Completed?: Yes  Screening Tests Health Maintenance  Topic Date Due   COVID-19 Vaccine (5 - 2024-25 season) 07/11/2023 (Originally 09/14/2022)   INFLUENZA VACCINE  08/14/2023   Diabetic kidney evaluation - eGFR measurement  06/03/2024   Diabetic kidney evaluation -  Urine ACR  06/03/2024   Medicare Annual Wellness (AWV)  06/24/2024   Colonoscopy  02/05/2025   DTaP/Tdap/Td (2 - Td or Tdap) 05/19/2032   Pneumococcal Vaccine: 50+ Years  Completed   Hepatitis C Screening  Completed   Zoster Vaccines- Shingrix   Completed   HPV VACCINES  Aged Out   Meningococcal B Vaccine  Aged Out    Health Maintenance  There are no preventive care reminders to display for this patient.   Colorectal cancer screening: Type of screening: Colonoscopy. Completed 01/2015. Repeat every 10 years  Lung Cancer Screening: (Low Dose CT Chest recommended if Age 35-80 years, 20 pack-year currently smoking OR have quit w/in 15years.) does not qualify.   Lung Cancer Screening Referral: na  Additional Screening:  Hepatitis C Screening: does  qualify; Completed   Vision Screening: Recommended annual ophthalmology exams for early detection of glaucoma and other disorders of the eye. Is the patient up to date with their annual eye exam?  No  Who is the provider or what is the name of the office in which the patient attends annual eye exams? [email or web address] If pt is not established with a provider, would they like to be referred to a provider to establish care? No .   Dental Screening: Recommended annual dental exams for proper oral hygiene   Community Resource Referral / Chronic Care Management: CRR required this visit?  No   CCM required this visit?  No     Plan:     I have personally reviewed and noted the following in the patient's chart:   Medical and social history Use of alcohol, tobacco or illicit drugs  Current medications and supplements including opioid prescriptions. Patient is not currently taking opioid prescriptions. Functional ability and status Nutritional status Physical activity Advanced directives List of other physicians Hospitalizations, surgeries, and ER visits in previous 12 months Vitals Screenings to include cognitive, depression, and falls Referrals and appointments  In addition, I have reviewed and discussed with patient certain preventive protocols, quality metrics, and best practice recommendations. A written personalized care plan for preventive services as well as general  preventive health recommendations were provided to patient.     Verma Gobble, NP   06/25/2023   After Visit Summary: (MyChart) Due to this being a telephonic visit, the after visit summary with patients personalized plan was offered to patient via MyChart

## 2023-06-25 NOTE — Patient Instructions (Signed)
  Mr. Roskos , Thank you for taking time to come for your Medicare Wellness Visit. I appreciate your ongoing commitment to your health goals. Please review the following plan we discussed and let me know if I can assist you in the future.    Make sure to make appt for your eye exam   This is a list of the screening recommended for you and due dates:  Health Maintenance  Topic Date Due   COVID-19 Vaccine (5 - 2024-25 season) 09/14/2022   Flu Shot  08/14/2023   Yearly kidney function blood test for diabetes  06/03/2024   Yearly kidney health urinalysis for diabetes  06/03/2024   Medicare Annual Wellness Visit  06/24/2024   Colon Cancer Screening  02/05/2025   DTaP/Tdap/Td vaccine (2 - Td or Tdap) 05/19/2032   Pneumococcal Vaccine for age over 68  Completed   Hepatitis C Screening  Completed   Zoster (Shingles) Vaccine  Completed   HPV Vaccine  Aged Out   Meningitis B Vaccine  Aged Out

## 2023-07-08 ENCOUNTER — Ambulatory Visit: Admitting: Student

## 2023-07-08 ENCOUNTER — Encounter: Payer: Self-pay | Admitting: Student

## 2023-07-08 VITALS — BP 124/68 | HR 71 | Temp 97.3°F | Ht 71.0 in | Wt 145.0 lb

## 2023-07-08 DIAGNOSIS — R634 Abnormal weight loss: Secondary | ICD-10-CM | POA: Diagnosis not present

## 2023-07-08 DIAGNOSIS — E782 Mixed hyperlipidemia: Secondary | ICD-10-CM

## 2023-07-08 NOTE — Progress Notes (Signed)
 [facility]: [facility] Provider: ABDUL  Code Status: Full Code Goals of Care:     06/25/2023   10:17 AM  Advanced Directives  Does Patient Have a Medical Advance Directive? Yes  Type of Estate agent of [place];Living will  Does patient want to make changes to medical advance directive? No - Patient declined  Copy of Healthcare Power of Attorney in Chart? Yes - validated most recent copy scanned in chart (See row information)     Chief Complaint  Patient presents with   Medical Management of Chronic Issues    Medical Management of Chronic Issues. 6 week follow up with Labs.     HPI: Patient is a 73 y.o. male seen today for medical management of chronic diseases.   Discussed the use of AI scribe software for clinical note transcription with the patient, who gave verbal consent to proceed.  History of Present Illness   David Gillespie is a 73 year old male who presents for a follow-up regarding weight management and cholesterol levels.  He has recently adopted a new morning routine that includes one cup of coffee with powdered protein milk, high protein Cheerios, cottage cheese, and [place] yogurt. Since starting this routine, he has gained five pounds, which is a change from his previous habit of only having two cups of coffee until lunchtime.  His total cholesterol has decreased from 234 to 209 over the past year. He has been taking omega-3 supplements. He consumes at least one glass of red wine every night, sometimes two, which may be affecting his HDL levels.  He has a history of fluctuating PSA levels, with a past spike that resolved without intervention. He currently experiences nocturia but reports no changes in urination patterns over the years. He is particularly interested in monitoring his PSA levels due to a friend with prostate cancer.  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 He stays hydrated by refilling bottles after drinking them.         Past Medical History:  Diagnosis Date   Anxiety    Arthritis    lower back    Past Surgical History:  Procedure Laterality Date   INGUINAL HERNIA REPAIR Right 10/28/2017   Procedure: RIGHT INGUINAL HERNIA REPAIR ERAS PATHWAY;  Surgeon: Ebbie Cough, MD;  Location: [facility] OR;  Service: General;  Laterality: Right;  TAP BLOCK   INSERTION OF MESH Right 10/28/2017   Procedure: INSERTION OF MESH;  Surgeon: Ebbie Cough, MD;  Location: [facility] OR;  Service: General;  Laterality: Right;   SMALL INTESTINE SURGERY     TONSILLECTOMY      No Known Allergies  Outpatient Encounter Medications as of 07/08/2023  Medication Sig   Coenzyme Q10 (COQ10) 100 MG CAPS Take 100 mg by mouth daily.   Misc Natural Products (OSTEO BI-FLEX TRIPLE STRENGTH PO) Take 1 tablet by mouth daily.   Multiple Vitamins-Minerals (SENIOR MULTIVITAMIN PLUS PO) Take 1 tablet by mouth daily.   Omega-3 Fatty Acids (FISH OIL ) 300 MG CAPS Take 1 capsule (300 mg total) by mouth daily.   saccharomyces boulardii (FLORASTOR) 250 MG capsule Take 1 capsule (250 mg total) by mouth 2 (two) times daily. (Patient not taking: Reported on 05/29/2023)   No facility-administered encounter medications on file as of 07/08/2023.    Review of Systems:  Review of Systems  Health Maintenance  Topic Date Due   FOOT  EXAM  Never done   OPHTHALMOLOGY EXAM  Never done   COVID-19 Vaccine (5 - 2024-25 season) 07/11/2023 (Originally 09/14/2022)   INFLUENZA VACCINE  08/14/2023   HEMOGLOBIN A1C  12/05/2023   Diabetic kidney evaluation - eGFR measurement  06/03/2024   Diabetic kidney evaluation - Urine ACR  06/03/2024   Medicare Annual Wellness (AWV)  06/24/2024   Colonoscopy  02/05/2025   DTaP/Tdap/Td (2 - Td or Tdap) 05/19/2032   Pneumococcal Vaccine: 50+ Years  Completed   Hepatitis C Screening  Completed   Zoster Vaccines- Shingrix  Completed    Hepatitis B Vaccines  Aged Out   HPV VACCINES  Aged Out   Meningococcal B Vaccine  Aged Out    Physical Exam: Vitals:   07/08/23 1530  BP: 124/68  Pulse: 71  Temp: (!) 97.3 F (36.3 C)  SpO2: 96%  Weight: 145 lb (65.8 kg)  Height: 5' 11 (1.803 m)   Body mass index is 20.22 kg/m. Physical Exam Constitutional:      Appearance: Normal appearance.   Cardiovascular:     Rate and Rhythm: Normal rate and regular rhythm.     Pulses: Normal pulses.     Heart sounds: Normal heart sounds.  Pulmonary:     Effort: Pulmonary effort is normal.  Abdominal:     General: Abdomen is flat. Bowel sounds are normal.     Palpations: Abdomen is soft.   Musculoskeletal:        General: No swelling or tenderness.   Skin:    General: Skin is warm and dry.   Neurological:     Mental Status: He is alert and oriented to person, place, and time.     Gait: Gait normal.   Psychiatric:        Mood and Affect: Mood normal.    Physical Exam   GENERAL: Normal appearance      Labs reviewed: Basic Metabolic Panel: Recent Labs    06/04/23 0730  NA 139  K 4.2  CL 102  CO2 28  GLUCOSE 81  BUN 22  CREATININE 0.73  CALCIUM 9.6  TSH 1.07   Liver Function Tests: Recent Labs    06/04/23 0730  AST 22  ALT 19  BILITOT 0.7  PROT 7.0   No results for input(s): LIPASE, AMYLASE in the last 8760 hours. No results for input(s): AMMONIA in the last 8760 hours. CBC: Recent Labs    06/04/23 0730  WBC 4.2  NEUTROABS 1,621  HGB 15.2  HCT 45.3  MCV 99.3  PLT 277   Lipid Panel: Recent Labs    11/17/22 0735 06/04/23 0730  CHOL 222* 209*  HDL 110 110  LDLCALC 97 85  TRIG 68 64  CHOLHDL 2.0 1.9   Lab Results  Component Value Date   HGBA1C 5.6 06/04/2023    Procedures since last visit: No results found. Results   LABS Total cholesterol: 209 mg/dL Hemoglobin J8r: 4.3% TSH: 1.07 IU/mL Vitamin B12: 436 pg/mL Vitamin D : 66 ng/mL      Assessment/Plan      Peripheral vascular disease Chronic peripheral vascular disease with differences in leg perfusion. Benefits of cholesterol management in slowing disease progression discussed.  Hyperlipidemia Chronic hyperlipidemia with total cholesterol at 209 mg/dL, decreased from 765 mg/dL last year. He meets criteria for cholesterol medication based on risk calculator. Discussed potential benefits of starting rosuvastatin, including reducing risk of myocardial infarction, cerebrovascular accident, and slowing progression of peripheral vascular disease. Common side effect includes myalgia,  and liver function tests require monitoring. He is currently taking omega-3 supplements, which can help with triglycerides and low-density lipoprotein levels. He is advised to consider the medication and will decide by next year's labs. - Consider starting rosuvastatin after deliberation. - Monitor liver function tests if rosuvastatin is initiated. - Continue omega-3 supplements.  General Health Maintenance Kidney and liver function, electrolytes, and blood count are normal. A1c is 5.6, thyroid  is 1.07, B12 is 436, and vitamin D  is 66. Discussed alcohol consumption and its effects on HDL and overall health. Recommended moderation in alcohol intake. - Continue current health maintenance regimen. - Moderate alcohol intake to 1-2 glasses of wine per day.  Prostate health Fluctuating PSA levels without current urinary symptoms. PSA testing deferred this year as per age guidelines. He prefers to defer PSA testing until next year. - Plan PSA testing next year.     Weight loss improved, continue dietary recommendations.      Labs/tests ordered:  * No order type specified * Next appt:  Visit date not found

## 2023-07-08 NOTE — Patient Instructions (Signed)
 VISIT SUMMARY:  Today, we discussed your weight management, cholesterol levels, and overall health. We reviewed your new morning routine, recent weight gain, and cholesterol improvements. We also talked about your peripheral vascular disease, hyperlipidemia, and prostate health.  YOUR PLAN:  -PERIPHERAL VASCULAR DISEASE: Peripheral vascular disease is a condition where the blood vessels outside your heart and brain narrow, reducing blood flow to your limbs. We discussed how managing your cholesterol can help slow the progression of this disease.  -HYPERLIPIDEMIA: Hyperlipidemia means having high levels of fats (lipids) in your blood, such as cholesterol and triglycerides. Your total cholesterol has improved, but we discussed the potential benefits of starting rosuvastatin to further reduce your risk of heart attack, stroke, and slow the progression of peripheral vascular disease. You are currently taking omega-3 supplements, which help with triglycerides and low-density lipoprotein levels. You will consider starting rosuvastatin and decide by next year's labs. If you start the medication, we will need to monitor your liver function.  -GENERAL HEALTH MAINTENANCE: Your kidney and liver function, electrolytes, and blood count are normal. Your A1c, thyroid , B12, and vitamin D  levels are also within normal ranges. We discussed the importance of moderating your alcohol intake to 1-2 glasses of wine per day to maintain good health.  -PROSTATE HEALTH: You have had fluctuating PSA levels in the past, but currently have no urinary symptoms. We decided to defer PSA testing this year as per age guidelines and plan to test next year.  INSTRUCTIONS:  Consider starting rosuvastatin after careful thought and decide by next year's labs. If you start the medication, we will need to monitor your liver function. Continue your current health maintenance regimen and moderate your alcohol intake to 1-2 glasses of wine per  day. Plan for PSA testing next year.  Take monthly weights. Continue taking 30 grams of protein every meal

## 2023-10-28 DIAGNOSIS — M9904 Segmental and somatic dysfunction of sacral region: Secondary | ICD-10-CM | POA: Diagnosis not present

## 2023-10-28 DIAGNOSIS — M9903 Segmental and somatic dysfunction of lumbar region: Secondary | ICD-10-CM | POA: Diagnosis not present

## 2023-10-28 DIAGNOSIS — M7918 Myalgia, other site: Secondary | ICD-10-CM | POA: Diagnosis not present

## 2023-10-28 DIAGNOSIS — M5451 Vertebrogenic low back pain: Secondary | ICD-10-CM | POA: Diagnosis not present

## 2023-11-02 DIAGNOSIS — M9903 Segmental and somatic dysfunction of lumbar region: Secondary | ICD-10-CM | POA: Diagnosis not present

## 2023-11-02 DIAGNOSIS — M7918 Myalgia, other site: Secondary | ICD-10-CM | POA: Diagnosis not present

## 2023-11-02 DIAGNOSIS — M5451 Vertebrogenic low back pain: Secondary | ICD-10-CM | POA: Diagnosis not present

## 2023-11-02 DIAGNOSIS — M9904 Segmental and somatic dysfunction of sacral region: Secondary | ICD-10-CM | POA: Diagnosis not present

## 2024-05-11 ENCOUNTER — Encounter: Admitting: Orthopedic Surgery

## 2024-05-11 ENCOUNTER — Ambulatory Visit: Payer: Self-pay | Admitting: Student

## 2024-06-28 ENCOUNTER — Ambulatory Visit: Payer: Self-pay | Admitting: Nurse Practitioner
